# Patient Record
Sex: Female | Born: 1975 | Race: Black or African American | Hispanic: No | Marital: Single | Smoking: Never smoker
Health system: Southern US, Community
[De-identification: ages and names within clinical notes are randomized; demographics above are authoritative.]

## PROBLEM LIST (undated history)

## (undated) DIAGNOSIS — R0600 Dyspnea, unspecified: Secondary | ICD-10-CM

## (undated) DIAGNOSIS — I10 Essential (primary) hypertension: Secondary | ICD-10-CM

## (undated) DIAGNOSIS — G43909 Migraine, unspecified, not intractable, without status migrainosus: Secondary | ICD-10-CM

## (undated) DIAGNOSIS — K219 Gastro-esophageal reflux disease without esophagitis: Secondary | ICD-10-CM

## (undated) DIAGNOSIS — D649 Anemia, unspecified: Secondary | ICD-10-CM

## (undated) HISTORY — PX: ABDOMINAL HYSTERECTOMY: SHX81

## (undated) SURGERY — Surgical Case
Anesthesia: *Unknown

---

## 2001-01-25 ENCOUNTER — Observation Stay (HOSPITAL_COMMUNITY): Admission: AD | Admit: 2001-01-25 | Discharge: 2001-01-25 | Payer: Self-pay | Admitting: *Deleted

## 2001-01-26 ENCOUNTER — Other Ambulatory Visit: Admission: RE | Admit: 2001-01-26 | Discharge: 2001-01-26 | Payer: Self-pay | Admitting: *Deleted

## 2001-01-29 ENCOUNTER — Ambulatory Visit (HOSPITAL_COMMUNITY): Admission: RE | Admit: 2001-01-29 | Discharge: 2001-01-29 | Payer: Self-pay | Admitting: *Deleted

## 2001-01-29 ENCOUNTER — Encounter: Payer: Self-pay | Admitting: *Deleted

## 2001-05-27 ENCOUNTER — Inpatient Hospital Stay (HOSPITAL_COMMUNITY): Admission: AD | Admit: 2001-05-27 | Discharge: 2001-05-29 | Payer: Self-pay | Admitting: *Deleted

## 2003-04-26 ENCOUNTER — Encounter: Payer: Self-pay | Admitting: *Deleted

## 2003-04-26 ENCOUNTER — Emergency Department (HOSPITAL_COMMUNITY): Admission: EM | Admit: 2003-04-26 | Discharge: 2003-04-26 | Payer: Self-pay | Admitting: *Deleted

## 2006-09-21 ENCOUNTER — Emergency Department (HOSPITAL_COMMUNITY): Admission: EM | Admit: 2006-09-21 | Discharge: 2006-09-21 | Payer: Self-pay | Admitting: Emergency Medicine

## 2006-09-22 ENCOUNTER — Emergency Department (HOSPITAL_COMMUNITY): Admission: EM | Admit: 2006-09-22 | Discharge: 2006-09-22 | Payer: Self-pay | Admitting: Emergency Medicine

## 2006-09-29 ENCOUNTER — Observation Stay (HOSPITAL_COMMUNITY): Admission: EM | Admit: 2006-09-29 | Discharge: 2006-10-02 | Payer: Self-pay | Admitting: Emergency Medicine

## 2006-10-10 ENCOUNTER — Encounter (INDEPENDENT_AMBULATORY_CARE_PROVIDER_SITE_OTHER): Payer: Self-pay | Admitting: *Deleted

## 2006-10-10 ENCOUNTER — Ambulatory Visit: Payer: Self-pay | Admitting: Family Medicine

## 2006-10-11 ENCOUNTER — Encounter (INDEPENDENT_AMBULATORY_CARE_PROVIDER_SITE_OTHER): Payer: Self-pay | Admitting: Family Medicine

## 2006-10-11 LAB — CONVERTED CEMR LAB
AST: 17 units/L (ref 0–37)
Albumin: 3.6 g/dL (ref 3.5–5.2)
Alkaline Phosphatase: 85 units/L (ref 39–117)
Anti Nuclear Antibody(ANA): NEGATIVE
Hemoglobin: 10.8 g/dL — ABNORMAL LOW (ref 12.0–15.0)
Iron: 71 ug/dL (ref 42–145)
Lymphocytes Relative: 44 % (ref 12–46)
Neutro Abs: 1.9 10*3/uL (ref 1.7–7.7)
Platelets: 497 10*3/uL — ABNORMAL HIGH (ref 150–400)
Potassium: 3.9 meq/L (ref 3.5–5.3)
RDW: 14.5 % — ABNORMAL HIGH (ref 11.5–14.0)
Retic Count, Absolute: 67 (ref 19.0–186.0)
Retic Ct Pct: 1.7 % (ref 0.4–3.1)
Saturation Ratios: 24 % (ref 20–55)
Sodium: 138 meq/L (ref 135–145)
TIBC: 302 ug/dL (ref 250–470)
Total Protein: 7.8 g/dL (ref 6.0–8.3)

## 2006-10-13 ENCOUNTER — Encounter: Payer: Self-pay | Admitting: Family Medicine

## 2006-10-13 ENCOUNTER — Ambulatory Visit (HOSPITAL_COMMUNITY): Admission: RE | Admit: 2006-10-13 | Discharge: 2006-10-13 | Payer: Self-pay | Admitting: Family Medicine

## 2006-10-13 DIAGNOSIS — G43909 Migraine, unspecified, not intractable, without status migrainosus: Secondary | ICD-10-CM | POA: Insufficient documentation

## 2006-10-13 DIAGNOSIS — D509 Iron deficiency anemia, unspecified: Secondary | ICD-10-CM

## 2006-10-13 DIAGNOSIS — J309 Allergic rhinitis, unspecified: Secondary | ICD-10-CM | POA: Insufficient documentation

## 2006-10-24 ENCOUNTER — Ambulatory Visit: Payer: Self-pay | Admitting: Family Medicine

## 2006-10-25 ENCOUNTER — Encounter (INDEPENDENT_AMBULATORY_CARE_PROVIDER_SITE_OTHER): Payer: Self-pay | Admitting: Family Medicine

## 2006-10-25 LAB — CONVERTED CEMR LAB
Eosinophils Relative: 5 % (ref 0–5)
HCT: 35.3 % — ABNORMAL LOW (ref 36.0–46.0)
Hemoglobin: 11.6 g/dL — ABNORMAL LOW (ref 12.0–15.0)
Lymphocytes Relative: 36 % (ref 12–46)
Lymphs Abs: 1.3 10*3/uL (ref 0.7–3.3)
Monocytes Absolute: 0.3 10*3/uL (ref 0.2–0.7)
Platelets: 333 10*3/uL (ref 150–400)
T3, Free: 3.4 pg/mL (ref 2.3–4.2)
TSH: 0.83 microintl units/mL (ref 0.350–5.50)
WBC: 3.7 10*3/uL — ABNORMAL LOW (ref 4.0–10.5)

## 2006-10-27 ENCOUNTER — Encounter (INDEPENDENT_AMBULATORY_CARE_PROVIDER_SITE_OTHER): Payer: Self-pay | Admitting: *Deleted

## 2006-10-31 ENCOUNTER — Ambulatory Visit: Payer: Self-pay | Admitting: Family Medicine

## 2006-11-05 ENCOUNTER — Emergency Department (HOSPITAL_COMMUNITY): Admission: EM | Admit: 2006-11-05 | Discharge: 2006-11-05 | Payer: Self-pay | Admitting: Emergency Medicine

## 2006-11-06 ENCOUNTER — Emergency Department (HOSPITAL_COMMUNITY): Admission: EM | Admit: 2006-11-06 | Discharge: 2006-11-06 | Payer: Self-pay | Admitting: Emergency Medicine

## 2007-07-11 IMAGING — CR DG ANKLE COMPLETE 3+V*R*
3 series · 3 of 3 positions shown · non-contrast
Comparison: none

CLINICAL DATA: Ankle pain and swelling status post fall yesterday.
 RIGHT ANKLE ? 3 VIEW:

[view not recorded (1 of 3)]
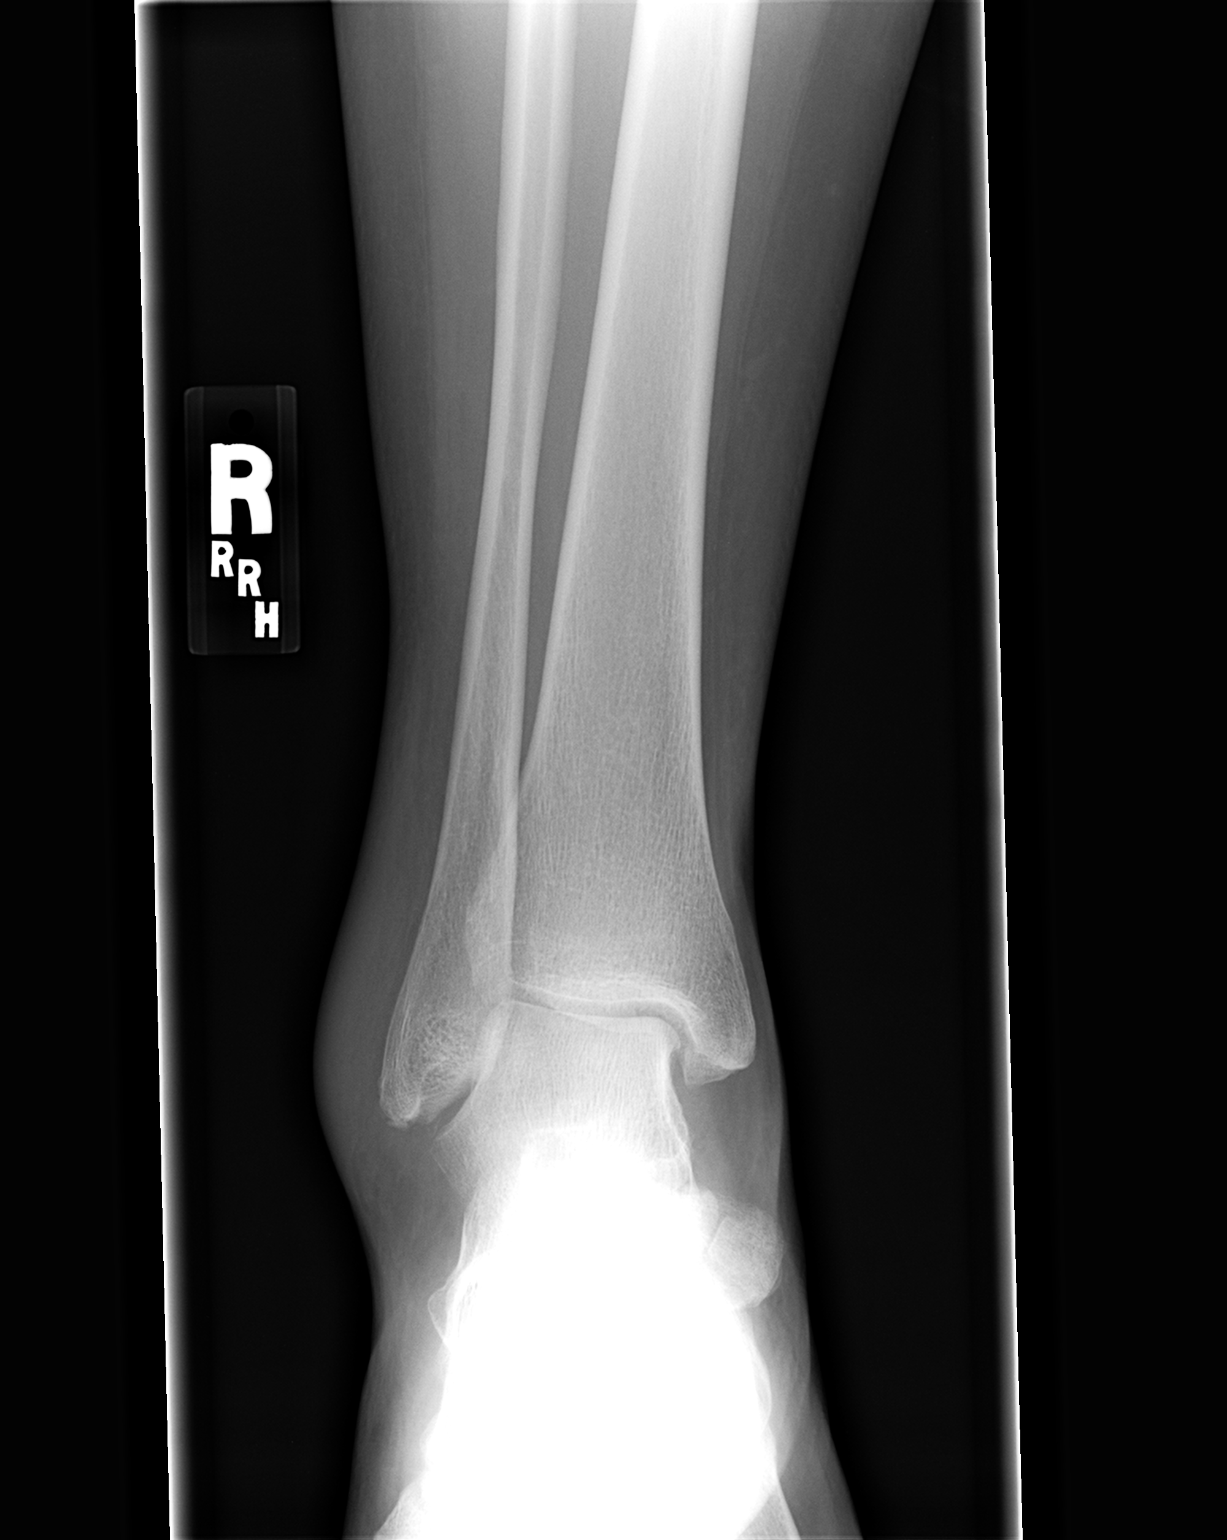

[view not recorded (2 of 3)]
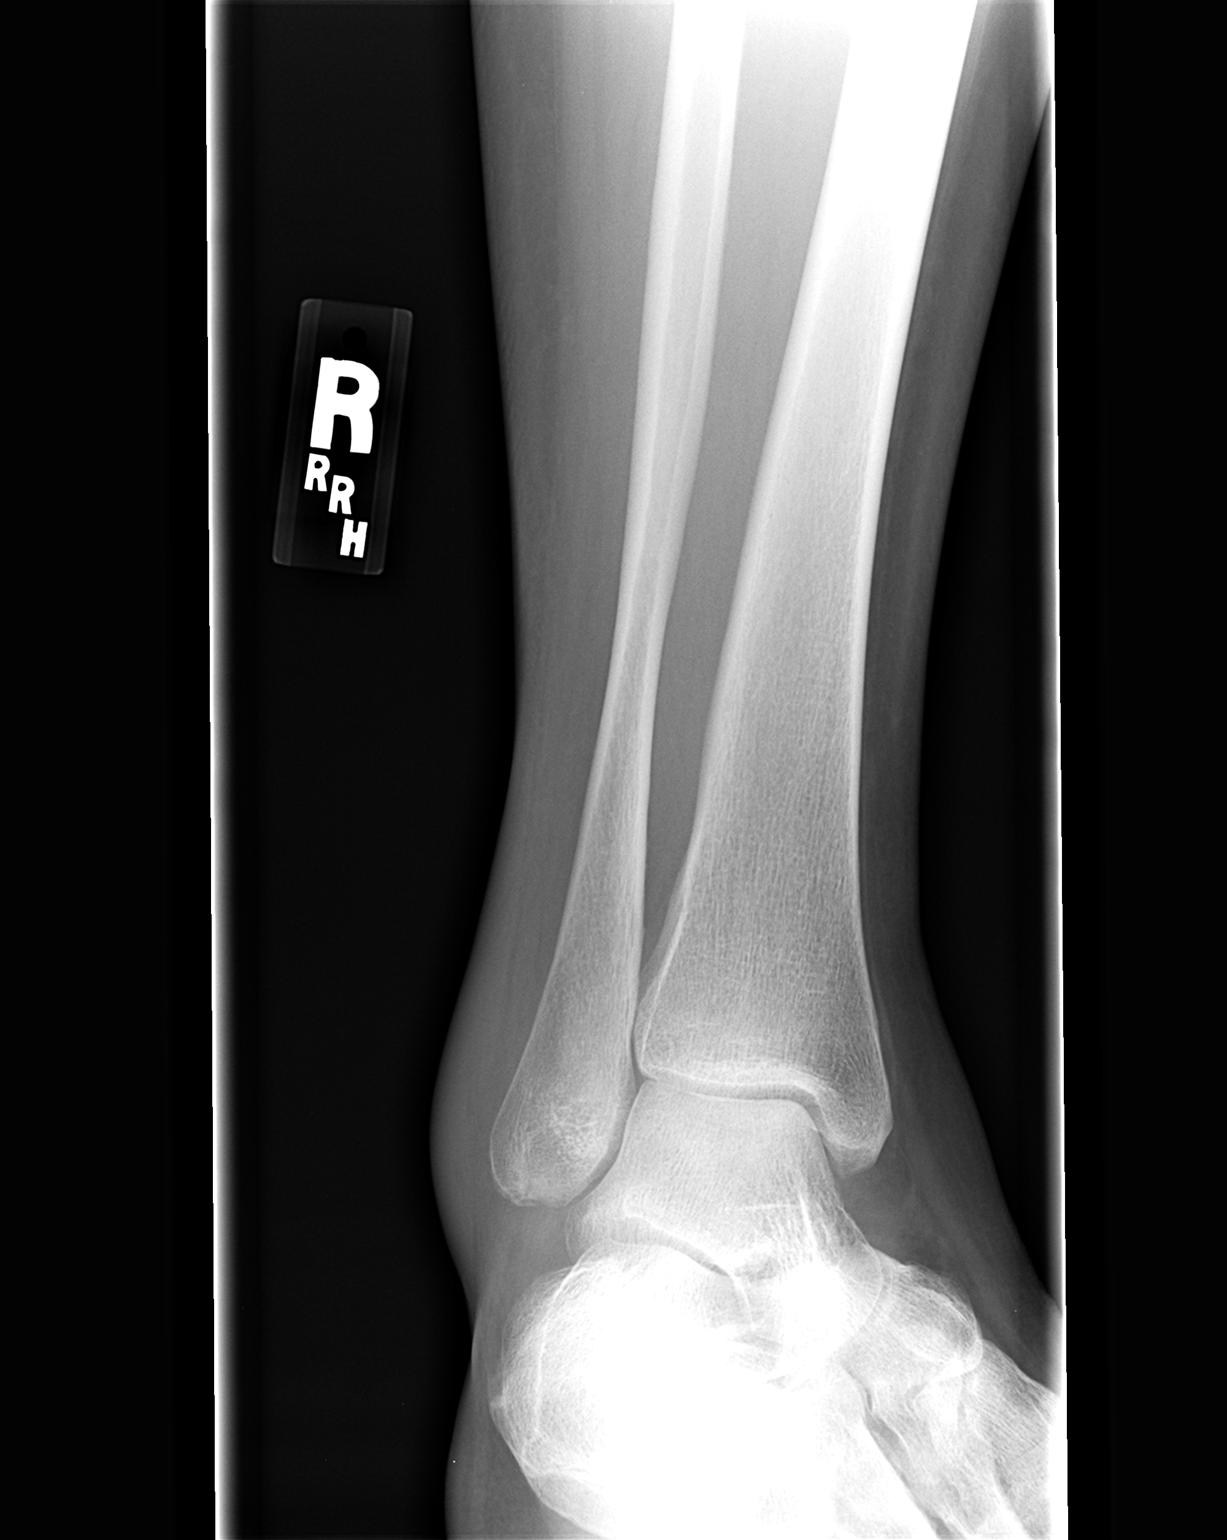

[view not recorded (3 of 3)]
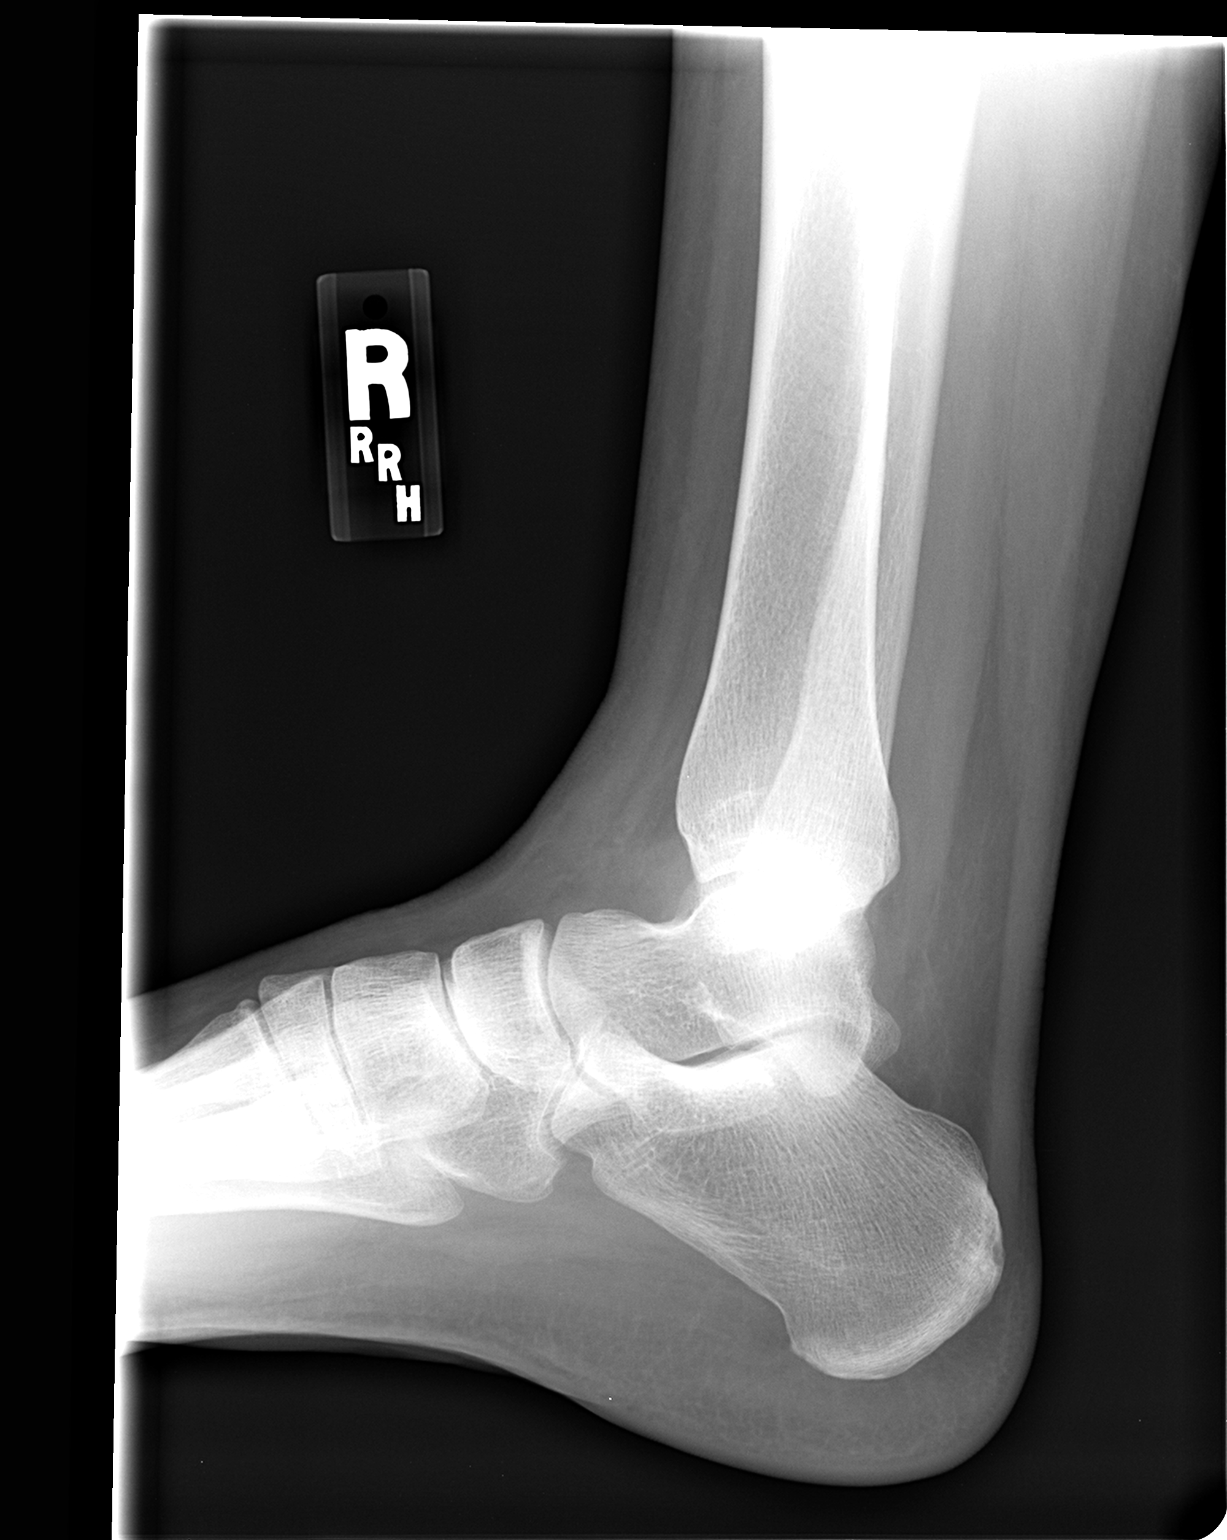

[3 of 3 positions shown; findings below may reference images not displayed]

FINDINGS: There is moderate lateral soft tissue swelling with probable small acute avulsion fractures from the fibular tip.  There is no widening of the ankle mortise or evidence of dislocation. No other fractures are seen. There is a probable small ankle joint effusion.
IMPRESSION: Lateral soft tissue swelling with probable small avulsion fractures from the fibular tip.

## 2008-09-02 ENCOUNTER — Ambulatory Visit: Payer: Self-pay | Admitting: Family Medicine

## 2008-09-02 DIAGNOSIS — E669 Obesity, unspecified: Secondary | ICD-10-CM

## 2008-09-02 DIAGNOSIS — H547 Unspecified visual loss: Secondary | ICD-10-CM | POA: Insufficient documentation

## 2008-09-02 DIAGNOSIS — I10 Essential (primary) hypertension: Secondary | ICD-10-CM | POA: Insufficient documentation

## 2008-09-02 LAB — CONVERTED CEMR LAB

## 2008-09-15 ENCOUNTER — Encounter (INDEPENDENT_AMBULATORY_CARE_PROVIDER_SITE_OTHER): Payer: Self-pay | Admitting: Family Medicine

## 2008-09-16 ENCOUNTER — Ambulatory Visit: Payer: Self-pay | Admitting: Family Medicine

## 2008-09-16 LAB — CONVERTED CEMR LAB
ALT: 8 units/L (ref 0–35)
Albumin: 3.9 g/dL (ref 3.5–5.2)
CO2: 22 meq/L (ref 19–32)
Calcium: 8.9 mg/dL (ref 8.4–10.5)
Chloride: 104 meq/L (ref 96–112)
Cholesterol, target level: 200 mg/dL
Cholesterol: 169 mg/dL (ref 0–200)
Eosinophils Relative: 4 % (ref 0–5)
Glucose, Bld: 91 mg/dL (ref 70–99)
HCT: 35.8 % — ABNORMAL LOW (ref 36.0–46.0)
HDL goal, serum: 40 mg/dL
LDL Goal: 160 mg/dL
Lymphocytes Relative: 37 % (ref 12–46)
Lymphs Abs: 2.1 10*3/uL (ref 0.7–4.0)
Neutro Abs: 3 10*3/uL (ref 1.7–7.7)
Platelets: 370 10*3/uL (ref 150–400)
Potassium: 4.2 meq/L (ref 3.5–5.3)
Sodium: 140 meq/L (ref 135–145)
Total Protein: 7.6 g/dL (ref 6.0–8.3)
Triglycerides: 66 mg/dL (ref <150)
WBC: 5.7 10*3/uL (ref 4.0–10.5)

## 2008-09-26 ENCOUNTER — Telehealth (INDEPENDENT_AMBULATORY_CARE_PROVIDER_SITE_OTHER): Payer: Self-pay | Admitting: *Deleted

## 2008-10-26 ENCOUNTER — Other Ambulatory Visit: Admission: RE | Admit: 2008-10-26 | Discharge: 2008-10-26 | Payer: Self-pay | Admitting: Family Medicine

## 2008-10-26 ENCOUNTER — Encounter (INDEPENDENT_AMBULATORY_CARE_PROVIDER_SITE_OTHER): Payer: Self-pay | Admitting: Family Medicine

## 2008-10-26 ENCOUNTER — Ambulatory Visit: Payer: Self-pay | Admitting: Family Medicine

## 2009-01-20 ENCOUNTER — Ambulatory Visit: Payer: Self-pay | Admitting: Family Medicine

## 2009-01-20 ENCOUNTER — Telehealth (INDEPENDENT_AMBULATORY_CARE_PROVIDER_SITE_OTHER): Payer: Self-pay | Admitting: *Deleted

## 2009-01-20 DIAGNOSIS — N920 Excessive and frequent menstruation with regular cycle: Secondary | ICD-10-CM

## 2009-01-20 LAB — CONVERTED CEMR LAB
Beta hcg, urine, semiquantitative: NEGATIVE
Hemoglobin: 11.8 g/dL

## 2009-01-26 ENCOUNTER — Encounter (INDEPENDENT_AMBULATORY_CARE_PROVIDER_SITE_OTHER): Payer: Self-pay | Admitting: Family Medicine

## 2009-02-20 ENCOUNTER — Encounter (INDEPENDENT_AMBULATORY_CARE_PROVIDER_SITE_OTHER): Payer: Self-pay | Admitting: *Deleted

## 2010-08-03 ENCOUNTER — Ambulatory Visit: Payer: Self-pay | Admitting: Family Medicine

## 2010-08-22 ENCOUNTER — Emergency Department (HOSPITAL_COMMUNITY): Admission: EM | Admit: 2010-08-22 | Discharge: 2010-08-22 | Payer: Self-pay | Admitting: Family Medicine

## 2010-10-21 ENCOUNTER — Encounter: Payer: Self-pay | Admitting: Family Medicine

## 2010-12-11 LAB — POCT RAPID STREP A (OFFICE): Streptococcus, Group A Screen (Direct): NEGATIVE

## 2011-02-15 NOTE — H&P (Signed)
[facility]  Patient:    [name], [name] Visit Number: [id number] MRN: [id number]          Service Type: Attending:  Langley Gauss, M.D. Dictated by:   Langley Gauss, M.D. Adm. Date:  05/27/01                           History and Physical  HISTORY OF PRESENT ILLNESS:  This is a 35 year old patient who was seen in the office for a scheduled visit complaining of irregular uterine contractions and pelvic pressure with uterine tightening. Cervical examination at that time revealed the cervix to be 3 cm dilated, 70% effaced, vertex is zero station. The patient was referred to [facility] in the early stages of labor. The patients prenatal course is uncomplicated. The patients past medical history is noncontributory.  PHYSICAL EXAMINATION:  GENERAL:  Healthy, alert, female.  VITAL SIGNS:  Blood pressure 132/70, pulse rate of 80, respiratory rate 20.  HEENT:  Negative. No adenopathy.  NECK:  Supple. Thyroid is not palpable.  LUNGS:  Clear.  CARDIOVASCULAR:  Regular rate and rhythm.  ABDOMEN:  Soft and nontender. No surgical scars are identified. Fundal height is 38 cm. Vertex presentation by [facility] maneuver.  EXTREMITIES:  Normal.  PELVIC:  Normal external genitalia. No leakage of fluid or bleeding noted. Cervix 3 cm dilated, 70% effaced, vertex at a zero station. Fetal heart tones auscultated in the 150s.  ASSESSMENT:  Term intrauterine pregnancy in the early stages of labor. The patient referred to [facility] for admission and amniotomy. Dictated by:   Langley Gauss, M.D. Attending:  Langley Gauss, M.D. DD:  07/08/01 TD:  07/08/01 Job: [id number]

## 2011-02-15 NOTE — Discharge Summary (Signed)
[name], [name] NO.:  [id number]   MEDICAL RECORD NO.:  [id number]          PATIENT TYPE:  OBV   LOCATION:  A306                          FACILITY:  [facility]   PHYSICIAN:  Marcello Moores, MD   DATE OF BIRTH:  [birth year]   DATE OF ADMISSION:  09/29/2006  DATE OF DISCHARGE:  01/03/2008LH                               DISCHARGE SUMMARY   Ms. [name] is a 35 year old African-American female with no past medical  history who was admitted on September 29, 2006 after presenting with  fever, itching hives and sore throat with generalized arthralgias. This  was for  1 week and she visited the emergency room at [facility] where she received prednisone and Benadryl. She took this  medication without any improvement and she decided to come by EMS to the  emergency room and admission was called. She had not shown any change.  She denies any urinary complaints, any pelvic complaint, no vaginal  discharge or bleeding during the admission.  The only pertinent finding  upon her admission was a temperature of 101 and an elevated white blood  cell count of 19,600. Otherwise the findings were within normal range.  She was admitted for observation and to monitor her white blood cell as  well as vital signs. No antibiotic was given and she was only receiving  symptomatic treatment for her arthralgia and fever. Her temperature and  white blood cell count presently improved and today her temperature was  98 and white blood cell count was 9700.   PHYSICAL EXAMINATION:  VITAL SIGNS: A young patient sitting in a chair  with vital signs showing a blood pressure of 130/70 and temperature was  98, pulse rate 96 and respiratory rate 16 per minute. HEENT: She has  pink conjunctivae, non icteric sclerae. Throat is clear. CHEST with good  air entry bilaterally and no crepitus appreciated.  CVS: S1, S2 were  regular, no murmur is appreciated. ABDOMEN: Soft, no organomegaly, no  tenderness. Bowel sounds are normoactive. EXTREMITIES: She has no  detectable pedal edema, no cyanosis.  SKIN: Moist and there are no  abnormal lesions detected. CNS: Alert and oriented.   LABS AT DISCHARGE:  CBC today reveals a white blood cell count of 9700  and hemoglobin is 11, hematocrit is 32.2 and platelets 176,000.  Chemistries within normal range.   RECOMMENDATIONS:  I am discharging her today with clear advice to have  followup on this coming Monday, October 06, 2006 for possible repeat labs  and to be investigated for possible connective tissue disease. Mild  anemia is also present and I advised her to take iron pills,and discuss  with  her assined pmd.and have proper followup. she understood the  importance of followup.      Marcello Moores, MD  Electronically Signed     MT/MEDQ  D:  10/02/2006  T:  10/02/2006  Job:  [id number]

## 2011-02-15 NOTE — Discharge Summary (Signed)
[name], SCHOOLER              ACCOUNT NO.:  [id number]   MEDICAL RECORD NO.:  [id number]          PATIENT TYPE:  OBV   LOCATION:  A306                          FACILITY:  [facility]   PHYSICIAN:  Marcello Moores, MD   DATE OF BIRTH:  13-Dec-1975   DATE OF ADMISSION:  09/29/2006  DATE OF DISCHARGE:  01/03/2008LH                               DISCHARGE SUMMARY   PRIMARY CARE PHYSICIAN:  She has no primary medical care doctor.   [name] is a 35 year old African-American young patient with no  significant past medical history who was admitted on September 29, 2006.  After she presented to ER with fever and arthralgia and itching lesions  for one week duration.  She was admitted to [facility]  emergency room for that complaint, and she was given prednisone and  Benadryl, and despite she was taking this medication, there was no  improvement, and she decided to come to the emergency room.  The patient  denied any other complaint.  She has no diarrhea.  She has no cough.  She has no urinary complaints.  She has no vaginal discharge.  During  the admission, the only pertinent finding was she has a fever 101, and  she has elevated white blood cells 19.6.  She was admitted with the  assessment of acute viral syndrome for observation and monitoring of  white blood cells and temperature.  On the hospital, her temperature and  white blood cells were monitored, and presently it was decreasing.  Today, her temperature was 98 and white blood cells was 9.7.  Her  arthralgia was also improved, while she was staying in the hospital.   PHYSICAL EXAMINATION:  GENERAL:  A young patient on the chair, sitting  comfortably with her mother.  VITAL SIGNS:  Blood pressure is 130/70 and the temperature is 98, and  the pulse rate 96 and respiratory rate is 16 per minute.  HEENT:  She has pink conjunctivae and an anicteric sclerae.  She has a  clear throat.  CHEST:  She has good air entry bilaterally,  no cramps.  CV:  She has S1, S2, regular rate.  There is no murmur.  ABDOMEN:  She has soft and normoactive bowel sound.  There was no area  of tenderness.  EXTREMITIES:  She has no pitting edema or cyanosis.  SKIN:  There is no invisible abnormal lesions.  CNS:  She is alert and oriented.   LABORATORY STUDIES:  Today, CBC is 9.7 and hematocrit is 33 and  hemoglobin is 11.1, and platelets are 176.  Chemistries within normal  range.  her symptoms rsolved comletly no fever,  no leucocytosis.  Today, she discharged improved and with clearly advice to have followup  with assigned PMD on January 7th, on Monday, to have followup on her CBC  and for possible workup of connective tissue disorders.     Marcello Moores, MD  Electronically Signed    MT/MEDQ  D:  10/02/2006  T:  10/02/2006  Job:  [id number]

## 2011-02-15 NOTE — H&P (Signed)
[name], [name] NO.:  [id number]   MEDICAL RECORD NO.:  [id number]          PATIENT TYPE:  OBV   LOCATION:  A306                          FACILITY:  [facility]   PHYSICIAN:  Gardiner Barefoot, MD    DATE OF BIRTH:  June 08, 1976   DATE OF ADMISSION:  09/29/2006  DATE OF DISCHARGE:  LH                              HISTORY & PHYSICAL   PRIMARY CARE PHYSICIAN:  Patient has no primary physician.   CHIEF COMPLAINT:  Aching in all joints.   HISTORY OF PRESENT ILLNESS:  [name] is a 35 year old African-  American female who about a week ago started with sore throat and hives  developing on her face, which were very itchy, and which she self-  treated symptomatically at home.  It began to go away; however, she  noted that she began to get more hives on her arms and elsewhere on her  body.  She took some Benadryl but continued to feel worse and more itchy  and presented to the emergency room at [facility], where she was given  prednisone and Benadryl.  The patient continued to feel worse, despite  that treatment, and called EMS to bring her into the emergency room here  to be seen.  The patient's basic complaint was her joint complaints all  over, which have essentially been developing over the week and was  initially starting in the knee and has progressed to her upper extremity  joints, including her elbows and fingers as well as her hip joints and  her knees; however, the patient does report that she had not noticed any  swelling and has had full range of motion in her joints, just pain.  The  patient reports  that she has never had hives or any of these issues  prior to this.  Also, patient does report that she has had a fever at  home of 103 during these episodes.   PAST MEDICAL HISTORY:  History of migraine headaches.   MEDICATIONS:  Percocet and prednisone.   ALLERGIES:  PENICILLIN, which causes hives.   Social history: pt denies alcohol, tobacco or drugs.   FAMILY HISTORY:  No history of hives or skin disease in the family.   REVIEW OF SYSTEMS:  A complete 12-point review of systems was obtained  and was negative other than that as presented in the history of present  illness.   PHYSICAL EXAMINATION:  VITAL SIGNS:  Temperature is 98, pulse 115,  respirations 18, blood pressure 117/75.  O2 sat is 98% on room air.  GENERAL:  The patient is awake, alert and oriented x3 and appears in no  acute distress.  HEENT:  Anicteric.  No thrush.  CARDIOVASCULAR:  Regular rate and rhythm.  No murmurs, rubs or gallops.  LUNGS:  Clear to auscultation bilaterally.  ABDOMEN:  Soft, nontender, nondistended.  Positive bowel sounds.  No  hepatosplenomegaly.  EXTREMITIES:  No clubbing, cyanosis or edema.  SKIN:  Dry skin but no rashes.  MUSCULOSKELETAL:  No joint effusions.  Full range of motion.   LABORATORY DATA:  The CBC  has a white count of 19.6 with 87%  neutrophils, hemoglobin 10.3.  Sodium 136, potassium 3.3, chloride 101,  bicarb 25, glucose 102, BUN 14, creatinine 0.74.  UA is negative.  Mono  spot is negative.   ASSESSMENT/PLAN:  Joint effusion:  The patient's etiology brings up a  differential, including rheumatologic issues such as rheumatic fever,  even systemic lupus erythematosus; however, it does not appear that she  really has not had any rash other than hives, which is not consistent  with either lupus or other rheumatologic issues, doubt that at this  time.  Infectious causes could include either gonorrhea or with a rash,  possibly syphilis, which can cause some achiness during the secondary  syphilis.  Otherwise, reactive arthritis is a possibility; however, that  typically only effects one joint.  At this time, will check her urine  for GC and Chlamydia and check an RPR, as well as a CRP, and CMP.  If  all that is unrevealing, we will consider swabs of the throat, rectum,  and vaginal swabs for GC, and will treat the patient  symptomatically at  this time.  If not etiology is determined, we will likely just observe  the patient and have her follow up with a physician as soon as possible.      Gardiner Barefoot, MD  Electronically Signed     RWC/MEDQ  D:  09/29/2006  T:  09/29/2006  Job:  [id number]

## 2011-02-15 NOTE — Discharge Summary (Signed)
[facility]  Patient:    [name], [name] Visit Number: [id number] MRN: [id number]          Service Type: Attending:  Langley Gauss, M.D. Dictated by:   Langley Gauss, M.D. Adm. Date:  05/27/01 Disc. Date: 05/29/01                             Discharge Summary  DIAGNOSES: 1. Term intrauterine pregnancy presenting to [facility] in labor. 2. Dysfunctional labor pattern requiring Pitocin augmentation secondary to    inadequate frequency and intensity of uterine contractions.  PROCEDURE: 1. Precipitous spontaneous cystovaginal delivery. 2. Repair of secondary perineal laceration.  DISPOSITION:  The patient is to follow up in the office in four weeks time. She is bottle feeding. She would like to initiate oral contraceptives for postpartum birth control.  PERTINENT LABORATORY STUDIES:  Admission hemoglobin and hematocrit 10.5/29.8, postpartum day #1 9.7/25.6 with white count of 12.1. The patient is given a copy of standardized discharge instructions at the time of discharge.  HOSPITAL COURSE: See previous dictations. The patient delivered precipitously in a well-controlled atraumatic nurse-controlled delivery. I was present for delivery of the placenta as well as repair of the laceration. Postpartum the patient did well and bonded well with the infant. She did bottle feeding. She was discharged to home on postpartum day #1-1/2.  DISCHARGE MEDICATIONS:  Vicoprofen and Ortho Tri-Cyclen to initiate use in two weeks time. Dictated by:   Langley Gauss, M.D. Attending:  Langley Gauss, M.D. DD:  07/08/01 TD:  07/08/01 Job: [id number]

## 2011-06-05 ENCOUNTER — Encounter: Payer: Self-pay | Admitting: Family Medicine

## 2013-02-01 ENCOUNTER — Emergency Department (HOSPITAL_COMMUNITY)
Admission: EM | Admit: 2013-02-01 | Discharge: 2013-02-01 | Disposition: A | Discharge status: Home / Self Care | Payer: 59 | Attending: Emergency Medicine | Admitting: Emergency Medicine

## 2013-02-01 ENCOUNTER — Encounter (HOSPITAL_COMMUNITY): Payer: Self-pay | Admitting: *Deleted

## 2013-02-01 ENCOUNTER — Emergency Department (HOSPITAL_COMMUNITY): Payer: 59

## 2013-02-01 DIAGNOSIS — G43909 Migraine, unspecified, not intractable, without status migrainosus: Secondary | ICD-10-CM | POA: Insufficient documentation

## 2013-02-01 DIAGNOSIS — D259 Leiomyoma of uterus, unspecified: Secondary | ICD-10-CM

## 2013-02-01 DIAGNOSIS — Z7982 Long term (current) use of aspirin: Secondary | ICD-10-CM | POA: Insufficient documentation

## 2013-02-01 DIAGNOSIS — Z79899 Other long term (current) drug therapy: Secondary | ICD-10-CM | POA: Insufficient documentation

## 2013-02-01 DIAGNOSIS — Z880 Allergy status to penicillin: Secondary | ICD-10-CM | POA: Insufficient documentation

## 2013-02-01 DIAGNOSIS — R1032 Left lower quadrant pain: Secondary | ICD-10-CM | POA: Insufficient documentation

## 2013-02-01 HISTORY — DX: Migraine, unspecified, not intractable, without status migrainosus: G43.909

## 2013-02-01 LAB — CBC WITH DIFFERENTIAL/PLATELET
Basophils Relative: 1 % (ref 0–1)
Eosinophils Absolute: 0.1 10*3/uL (ref 0.0–0.7)
HCT: 35.1 % — ABNORMAL LOW (ref 36.0–46.0)
Hemoglobin: 11.5 g/dL — ABNORMAL LOW (ref 12.0–15.0)
MCH: 26.4 pg (ref 26.0–34.0)
MCHC: 32.8 g/dL (ref 30.0–36.0)
Monocytes Absolute: 0.3 10*3/uL (ref 0.1–1.0)
Monocytes Relative: 5 % (ref 3–12)
Neutrophils Relative %: 58 % (ref 43–77)

## 2013-02-01 LAB — URINALYSIS, ROUTINE W REFLEX MICROSCOPIC
Hgb urine dipstick: NEGATIVE
Specific Gravity, Urine: 1.011 (ref 1.005–1.030)
Urobilinogen, UA: 0.2 mg/dL (ref 0.0–1.0)
pH: 6.5 (ref 5.0–8.0)

## 2013-02-01 LAB — URINE MICROSCOPIC-ADD ON

## 2013-02-01 LAB — POCT PREGNANCY, URINE: Preg Test, Ur: NEGATIVE

## 2013-02-01 LAB — COMPREHENSIVE METABOLIC PANEL
Albumin: 3.8 g/dL (ref 3.5–5.2)
BUN: 9 mg/dL (ref 6–23)
Calcium: 9 mg/dL (ref 8.4–10.5)
Creatinine, Ser: 0.65 mg/dL (ref 0.50–1.10)
Total Protein: 8.6 g/dL — ABNORMAL HIGH (ref 6.0–8.3)

## 2013-02-01 LAB — WET PREP, GENITAL
Trich, Wet Prep: NONE SEEN
Yeast Wet Prep HPF POC: NONE SEEN

## 2013-02-01 LAB — LIPASE, BLOOD: Lipase: 22 U/L (ref 11–59)

## 2013-02-01 MED ORDER — HYDROCODONE-ACETAMINOPHEN 5-325 MG PO TABS: 1.0000 | ORAL_TABLET | Freq: Four times a day (QID) | ORAL | Status: DC | PRN

## 2013-02-01 MED ORDER — MORPHINE SULFATE 4 MG/ML IJ SOLN
4.0000 mg | Freq: Once | INTRAMUSCULAR | Status: AC
  Administered 2013-02-01: 4 mg via INTRAVENOUS
  Filled 2013-02-01: qty 1

## 2013-02-01 NOTE — ED Provider Notes (Signed)
History     CSN: [id number]  Arrival date & time 02/01/13  0418   First MD Initiated Contact with Patient 02/01/13 [phone]      Chief Complaint  Patient presents with  . Abdominal Pain    (Consider location/radiation/quality/duration/timing/severity/associated sxs/prior treatment) HPI History provided by pt.   Pt has had constant, severe, sharp, non-radiating pain in LLQ since 3am.  Onset while at work.  No associated fever, N/V/D, hematochezia/melena, urinary or vaginal sx.  No h/o same.  No h/o abdominal surgeries.  Denies trauma.  Lifts heavy objects at work.  Past Medical History  Diagnosis Date  . Migraines     History reviewed. No pertinent past surgical history.  No family history on file.  History  Substance Use Topics  . Smoking status: Never Smoker   . Smokeless tobacco: Not on file  . Alcohol Use: No    OB History   Grav Para Term Preterm Abortions TAB SAB Ect Mult Living                  Review of Systems  All other systems reviewed and are negative.    Allergies  Penicillins  Home Medications   Current Outpatient Rx  Name  Route  Sig  Dispense  Refill  . aspirin-acetaminophen-caffeine (EXCEDRIN MIGRAINE) 250-250-65 MG per tablet   Oral   Take 1 tablet by mouth every 6 (six) hours as needed for pain (migraines).         . butalbital-acetaminophen-caffeine (FIORICET, ESGIC) 50-325-40 MG per tablet   Oral   Take 1 tablet by mouth 2 (two) times daily as needed for headache.         . promethazine (PHENERGAN) 25 MG tablet   Oral   Take 25 mg by mouth every 6 (six) hours as needed for nausea (associated with migraines).           BP 127/74  Pulse 109  Temp(Src) 97.8 F (36.6 C) (Oral)  Resp 20  SpO2 99%  LMP 01/19/2013  Physical Exam  Nursing note and vitals reviewed. Constitutional: She is oriented to person, place, and time. She appears well-developed and well-nourished. No distress.  Uncomfortable appearing  HENT:  Head:  Normocephalic and atraumatic.  Eyes:  Normal appearance  Neck: Normal range of motion.  Cardiovascular: Normal rate and regular rhythm.   Pulmonary/Chest: Effort normal and breath sounds normal. No respiratory distress.  Abdominal: Soft. Bowel sounds are normal. She exhibits no distension and no mass. There is no rebound and no guarding.  Diffuse L-sided abdominal tenderness  Genitourinary:  No CVA tenderness.  Nml external genitalia. Moderate amt thin, white vaginal discharge.  Cervix closed.  There appears to be an erythematous mass inferior to cervical os. No adnexal or cervical motion tenderess.    Musculoskeletal: Normal range of motion.  Neurological: She is alert and oriented to person, place, and time.  Skin: Skin is warm and dry. No rash noted.  Psychiatric: She has a normal mood and affect. Her behavior is normal.    ED Course  Procedures (including critical care time)  Labs Reviewed  WET PREP, GENITAL  GC/CHLAMYDIA PROBE AMP  URINALYSIS, ROUTINE W REFLEX MICROSCOPIC  CBC WITH DIFFERENTIAL  COMPREHENSIVE METABOLIC PANEL  LIPASE, BLOOD   No results found.   No diagnosis found.    MDM  37yo healthy F presents w/ LLQ pain since 3am.  No associated sx.  On exam, afebrile, uncomfortable appearing, abd soft/non-distended, diffuse L-sided ttp, vaginal discharge,  cervix erythematous.  Labs pending.  Transvaginal US to r/o TOA ordered.  Pt has received morphine for pain. Lawyer, PA-C to dispo.  6:14 AM         Otilio Miu, PA-C 02/01/13 [phone]

## 2013-02-01 NOTE — ED Provider Notes (Signed)
Medical screening examination/treatment/procedure(s) were performed by non-physician practitioner and as supervising physician I was immediately available for consultation/collaboration.  Malaak Stach M Kee Drudge, MD 02/01/13 0742 

## 2013-02-01 NOTE — ED Provider Notes (Signed)
Medical screening examination/treatment/procedure(s) were performed by non-physician practitioner and as supervising physician I was immediately available for consultation/collaboration.  Olivia Mackie, MD 02/01/13 [phone]

## 2013-02-01 NOTE — ED Provider Notes (Signed)
  Physical Exam  BP 140/92  Pulse 88  Temp(Src) 97.5 F (36.4 C) (Oral)  Resp 18  SpO2 100%  LMP 01/19/2013  Physical Exam  ED Course  Procedures I reassessed the patient and she still had some mild L lower abd pain on exam. No guarding or rebound. The patient advised that she felt much better at this time. The patient is advised that she could have an evolving process that has yet to fully declare itself and that she will need to return here for any worsening in her condition. The patient is advised to follow up with her PCP. I also advised her of the endometrial and uterine findings on [place]. I advised that these will need close follow up.     Carlyle Dolly, PA-C 02/01/13 [phone]

## 2013-02-01 NOTE — ED Notes (Signed)
Patient states she was at work [employer] when she started having left lower abdominal pain , denies n/v/d

## 2013-02-02 LAB — GC/CHLAMYDIA PROBE AMP: CT Probe RNA: NEGATIVE

## 2013-02-26 ENCOUNTER — Ambulatory Visit (INDEPENDENT_AMBULATORY_CARE_PROVIDER_SITE_OTHER): Payer: 59 | Admitting: Obstetrics & Gynecology

## 2013-02-26 ENCOUNTER — Encounter: Payer: Self-pay | Admitting: Obstetrics & Gynecology

## 2013-02-26 VITALS — BP 150/92 | HR 87 | Temp 97.2°F | Ht 64.0 in | Wt 200.9 lb

## 2013-02-26 DIAGNOSIS — N920 Excessive and frequent menstruation with regular cycle: Secondary | ICD-10-CM

## 2013-02-26 DIAGNOSIS — I10 Essential (primary) hypertension: Secondary | ICD-10-CM

## 2013-02-26 DIAGNOSIS — N949 Unspecified condition associated with female genital organs and menstrual cycle: Secondary | ICD-10-CM

## 2013-02-26 DIAGNOSIS — R1032 Left lower quadrant pain: Secondary | ICD-10-CM

## 2013-02-26 DIAGNOSIS — G8929 Other chronic pain: Secondary | ICD-10-CM

## 2013-02-26 MED ORDER — DICLOFENAC SODIUM 75 MG PO TBEC: 75.0000 mg | DELAYED_RELEASE_TABLET | Freq: Two times a day (BID) | ORAL | Status: DC | PRN

## 2013-02-26 NOTE — Assessment & Plan Note (Signed)
Advised a DASH type diet rich in vegetables and fruit and low in salt. Advised patient she needs to follow up with new PCP. Offered patient referral to [facility] resident clinic but she refused.

## 2013-02-26 NOTE — Progress Notes (Signed)
Subjective:  Patient presents today to establish care. Chief complaint-noted.   # ED follow up for LLQ pain as well as uterine fibroids and 1cm endometrial lesion. Also had menorrhagia within 9 months.  Patient seen in ED on 02/01/13 for severe 10/10 abdominal pain in LLQ which did not radiate. Workup was unrevealing for cause of pain but there was an incidental fibroid uterus noted as well as 1cm endometrial hypoechoic lesion.   Patient presents today to establish care and to follow up on above isuses. States she is intermittently having LLQ pain up to 5/10 lasting for 2 hours every 3-4 days for her. No dysuria/polyuria. No fever/chills/nausea/vomiting. Worse with positions such as bending over but really has not effected her much since the first visit to ED. Takes ibuprofen and pain controlled. She has not been taking the norco due pain occuring when she was in the   Patient states she has had regular periods and need to change pads about every 4 hours until 8-9 months ago. 8-9 months ago.  She states periods became heavier and now she requires pad changes every 1.5 hours typically but that they have remained regular. Periods are not particularly painful.   Hypertension- BP Readings from Last 3 Encounters:  02/26/13 150/92  02/01/13 140/92  08/03/10 132/90  Home BP monitoring-no Compliant with medications-no medications Denies any CP, HA, SOB, blurry vision, LE edema, transient weakness, orthopnea, PND.   The following were reviewed and entered/updated in epic: Past Medical History  Diagnosis Date  . 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  Medications- reviewed and updated Reviewed problem list.  Allergies-reviewed and updated   ROS--See HPI   Objective: BP 150/92  Pulse 87  Temp(Src) 97.2 F (36.2 C) (Oral)  Ht 5\' 4"  (1.626 m)  Wt 200 lb 14.4 oz (91.128 kg)  BMI 34.47 kg/m2  LMP 02/12/2013 Gen: NAD, resting  comfortably  HEENT: NCAT, MMM, PERRLA  CV: RRR no mrg  Lungs: CTAB  Abd: soft/nontender/nondistended/normal bowel sounds. No masses or organomegaly. Fibroids not palpable from abdominal exam.  Ext:  no edema  Skin: warm and dry, no rash  Neuro: grossly normal, moves all extremities   02/01/2013   TRANSABDOMINAL AND TRANSVAGINAL ULTRASOUND OF PELVIS DOPPLER ULTRASOUND OF OVARIES  Clinical Data:  Tuboovarian abscess.  Ovarian torsion.  Pelvic pain.  Technique:  Both transabdominal and transvaginal ultrasound examinations of the pelvis were performed. Transabdominal technique was performed for global imaging of the pelvis including uterus, ovaries, adnexal regions, and pelvic cul-de-sac.  It was necessary to proceed with endovaginal exam following the transabdominal exam to visualize the uterus and adnexal structures.  Color and duplex Doppler ultrasound was utilized to evaluate blood flow to the ovaries.  Comparison:  None.  Findings:  Uterus:  Uterus is enlarged measuring 14.7 cm x 6.5 cm x 7.2 cm. Multiple fibroids are present.  The uterus is retroverted and retroflexed.  Multiple myometrial fibroids are present with the largest measuring 7.6 x 5.9 x 6.5 cm in the fundus.  Endometrium:  Normal thickness at 6 mm.  There is a small hypoechoic area measuring 1 cm.  Differential considerations include subendometrial fibroid, polyp, or neoplasm.  Right ovary: Physiologic appearance.  33 mm x 14 mm x 18 mm.  Left ovary:   27 mm x 21 mm x 19 mm.  Pulsed Doppler evaluation demonstrates normal low-resistance arterial and venous waveforms in both ovaries.  Small amount of simple  free fluid is present in the anatomic pelvis.  IMPRESSION: 1.  No acute abnormality.  Negative for torsion. 2.  Fibroid uterus. 3.  1 cm endometrial lesion that is hypoechoic with questionable internal vascular flow.  Follow-up sonohysterogram recommended.   Original Report Authenticated By: Andreas Newport, M.D.    Assessment/Plan: LLQ  pain-Unlikely gynecological in etiology given largest fibroid is midline. Patient can use Diclofenac prn for pain control. Will continue to monitor symptoms Menorrhagia - Patient will return for further evaluation with endometrial biopsy, will also have annual pap smear at that time HTN - Follow up with PCP    Attestation of Attending Supervision of Resident: Evaluation and management procedures were performed by the [facility] Medicine Resident under my supervision.  I have seen and examined the patient, reviewed the resident's note and chart, and I agree with the management and plan.  Jaynie Collins, MD, FACOG Attending Obstetrician & Gynecologist Faculty Practice, [facility]

## 2013-02-26 NOTE — Patient Instructions (Signed)
Before your next visit, take your diclofenac that morning in preparation for your endometrial biopsy. I strongly encourage you to get a primary care doctor given your blood pressure today and need for general health maintenance.  We will do a pap smear and physical at next visit.   Endometrial Biopsy This is a test in which a tissue sample (a biopsy) is taken from inside the uterus (womb). It is then looked at by a specialist under a microscope to see if the tissue is normal or abnormal. The endometrium is the lining of the uterus. This test helps determine where you are in your menstrual cycle and how hormone levels are affecting the lining of the uterus. Another use for this test is to diagnose endometrial cancer, tuberculosis, polyps, or inflammatory conditions and to evaluate uterine bleeding. PREPARATION FOR TEST No preparation or fasting is necessary. NORMAL FINDINGS No pathologic conditions. Presence of "secretory-type" endometrium 3 to 5 days before to normal menstruation. Ranges for normal findings may vary among different laboratories and hospitals. You should always check with your doctor after having lab work or other tests done to discuss the meaning of your test results and whether your values are considered within normal limits. MEANING OF TEST  Your caregiver will go over the test results with you and discuss the importance and meaning of your results, as well as treatment options and the need for additional tests if necessary. OBTAINING THE TEST RESULTS It is your responsibility to obtain your test results. Ask the lab or department performing the test when and how you will get your results. Document Released: 01/17/2005 Document Revised: 12/09/2011 Document Reviewed: 08/26/2008 [facility] Patient Information 2014 [place], [place].

## 2013-02-26 NOTE — Assessment & Plan Note (Signed)
New onset within last 9 months. 6mm thickness on endometrium but with ? Small polyp. Will have patient return for endometrial biopsy at her earliest convenience and complete pap smear at that time. Possibly caused/contributed to by fundal fibroid. Will await endometrial biopsy before further plans. In the meantime, patient instructed to use Diclofenac for 1-2 days before period and then for 5 days.

## 2014-05-17 ENCOUNTER — Other Ambulatory Visit: Payer: Self-pay | Admitting: Obstetrics and Gynecology

## 2014-05-17 DIAGNOSIS — R928 Other abnormal and inconclusive findings on diagnostic imaging of breast: Secondary | ICD-10-CM

## 2014-07-27 ENCOUNTER — Other Ambulatory Visit: Payer: Self-pay | Admitting: Obstetrics and Gynecology

## 2014-07-27 ENCOUNTER — Ambulatory Visit
Admission: RE | Admit: 2014-07-27 | Discharge: 2014-07-27 | Disposition: A | Discharge status: Home / Self Care | Payer: 59 | Source: Ambulatory Visit | Attending: Obstetrics and Gynecology | Admitting: Obstetrics and Gynecology

## 2014-07-27 DIAGNOSIS — N6310 Unspecified lump in the right breast, unspecified quadrant: Secondary | ICD-10-CM

## 2014-07-27 DIAGNOSIS — R928 Other abnormal and inconclusive findings on diagnostic imaging of breast: Secondary | ICD-10-CM

## 2014-08-01 ENCOUNTER — Other Ambulatory Visit: Payer: Self-pay | Admitting: Obstetrics and Gynecology

## 2014-08-01 ENCOUNTER — Encounter: Payer: Self-pay | Admitting: Obstetrics & Gynecology

## 2014-08-01 DIAGNOSIS — N6310 Unspecified lump in the right breast, unspecified quadrant: Secondary | ICD-10-CM

## 2014-08-09 ENCOUNTER — Ambulatory Visit
Admission: RE | Admit: 2014-08-09 | Discharge: 2014-08-09 | Disposition: A | Discharge status: Home / Self Care | Payer: 59 | Source: Ambulatory Visit | Attending: Obstetrics and Gynecology | Admitting: Obstetrics and Gynecology

## 2014-08-09 DIAGNOSIS — N6310 Unspecified lump in the right breast, unspecified quadrant: Secondary | ICD-10-CM

## 2014-08-22 ENCOUNTER — Other Ambulatory Visit (INDEPENDENT_AMBULATORY_CARE_PROVIDER_SITE_OTHER): Payer: Self-pay | Admitting: General Surgery

## 2014-08-22 DIAGNOSIS — R928 Other abnormal and inconclusive findings on diagnostic imaging of breast: Secondary | ICD-10-CM

## 2014-08-22 NOTE — H&P (Signed)
[name] 08/22/2014 10:24 AM Location: [facility] Patient #: [id number] DOB: April 29, 1976 Single / Language: Cleophus Molt / Race: Black or African American Female  History of Present Illness Stark Klein MD; 08/22/2014 11:10 AM) Patient words: rt breast check.  The patient is a 38 year old female who presents with a complaint of nipple discharge. Pt presents with an incidental discovery of nipple discharge. She went to her GYN (Dr. Harrington Challenger) for a pap, and Dr. Harrington Challenger found the discharge on physical exam. She is referred by Dr. Augustin Coupe from radiology for consultation regarding this issue. There is no spontaneous discharge, only with squeezing the nipple. It is yellowish red. She was recommended to get a diagnostic mammogram. 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 She denies breast pain, breast mass, or other breast history. She has no family history of cancer. Menarche was at age 1. She had her first child between age 77-20. She underwent a biopsy of the lesion seen on mammogram and pathology is below. 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 - SEE MICROSCOPIC DESCRIPTION.   Other Problems Briant Cedar, CMA; 08/22/2014 10:24 AM) Back Pain Gastroesophageal Reflux Disease Migraine Headache  Past Surgical History Briant Cedar, [place]; 08/22/2014 10:24 AM) Breast Biopsy Right.  Diagnostic Studies History Briant Cedar, [place]; 08/22/2014 10:24 AM) Colonoscopy never Mammogram within last year Pap Smear 1-5 years ago  Allergies Briant Cedar, [place]; 08/22/2014 10:25 AM) Penicillins  Medication History Briant Cedar, CMA; 08/22/2014 10:26 AM) Tranexamic Acid (650MG  Tablet, Oral) Active.  Social History Briant Cedar, [place]; 08/22/2014 10:24  AM) Alcohol use Occasional alcohol use. Caffeine use Carbonated beverages, Tea. No drug use Tobacco use Never smoker.  Family History Briant Cedar, [place]; 08/22/2014 10:24 AM) Heart disease in female family member before age 49 Hypertension Father, Mother. Migraine Headache Mother.  Pregnancy / Birth History Briant Cedar, CMA; 08/22/2014 10:24 AM) Age at menarche 89 years. Contraceptive History Oral contraceptives. Gravida 2 Maternal age 79-20 Para 2  Review of Systems Briant Cedar CMA; 08/22/2014 10:24 AM) General Present- Fatigue. Not Present- Appetite Loss, Chills, Fever, Night Sweats, Weight Gain and Weight Loss. Skin Present- Dryness. Not Present- Change in Wart/Mole, Hives, Jaundice, New Lesions, Non-Healing Wounds, Rash and Ulcer. HEENT Present- Seasonal Allergies. Not Present- Earache, Hearing Loss, Hoarseness, Nose Bleed, Oral Ulcers, Ringing in the Ears, Sinus Pain, Sore Throat, Visual Disturbances, Wears glasses/contact lenses and Yellow Eyes. Respiratory Not Present- Bloody sputum, Chronic Cough, Difficulty Breathing, Snoring and Wheezing. Breast Present- Breast Mass and Breast Pain. Not Present- Nipple Discharge and Skin Changes. Cardiovascular Not Present- Chest Pain, Difficulty Breathing Lying Down, Leg Cramps, Palpitations, Rapid Heart Rate, Shortness of Breath and Swelling of Extremities. Gastrointestinal Present- Indigestion. Not Present- Abdominal Pain, Bloating, Bloody Stool, Change in Bowel Habits, Chronic diarrhea, Constipation, Difficulty Swallowing, Excessive gas, Gets full quickly at meals, Hemorrhoids, Nausea, Rectal Pain and Vomiting. Female Genitourinary Present- Frequency and Urgency. Not Present- Nocturia, Painful Urination and Pelvic Pain. Musculoskeletal Present- Back Pain and Joint Stiffness. Not Present- Joint Pain, Muscle Pain, Muscle Weakness and Swelling of Extremities. Neurological Present- Headaches. Not Present- Decreased Memory,  Fainting, Numbness, Seizures, Tingling, Tremor, Trouble walking and Weakness. Psychiatric Present- Change in Sleep Pattern. Not Present- Anxiety, Bipolar, Depression, Fearful and Frequent crying. Endocrine Not Present- Cold Intolerance, Excessive Hunger, Hair Changes, Heat Intolerance, Hot flashes and New  Diabetes. Hematology Present- Easy Bruising. Not Present- Excessive bleeding, Gland problems, HIV and Persistent Infections.   Vitals Briant Cedar CMA; 08/22/2014 10:26 AM) 08/22/2014 10:26 AM Weight: 201.38 lb Height: 64in Body Surface Area: 2.03 m Body Mass Index: 34.57 kg/m Temp.: 98.30F  Pulse: 74 (Regular)  BP: 140/90 (Sitting, Left Arm, Standard)    Physical Exam Stark Klein MD; 08/22/2014 11:06 AM) General Mental Status-Alert. General Appearance-Consistent with stated age. Hydration-Well hydrated. Voice-Normal.  Head and Neck Head-normocephalic, atraumatic with no lesions or palpable masses. Trachea-midline. Thyroid Gland Characteristics - normal size and consistency.  Eye Eyeball - Bilateral-Extraocular movements intact. Sclera/Conjunctiva - Bilateral-No scleral icterus.  Chest and Lung Exam Chest and lung exam reveals -quiet, even and easy respiratory effort with no use of accessory muscles and on auscultation, normal breath sounds, no adventitious sounds and normal vocal resonance. Inspection Chest Wall - Normal. Back - normal.  Breast Note: Breasts are symmetric bilaterally. Left breast is slightly smaller. She has a small skin lesion at 2 oclock on the left breast near the anterior axillary line that feels like a small sebaceous cyst. She has serosanguinous discharge of her right nipple with squeezing. There are no masses or LAD. She has no skin dimpling or lymphadenopathy.   Cardiovascular Cardiovascular examination reveals -normal heart sounds, regular rate and rhythm with no murmurs and normal pedal pulses  bilaterally.  Abdomen Inspection Inspection of the abdomen reveals - No Hernias. Palpation/Percussion Palpation and Percussion of the abdomen reveal - Soft, Non Tender, No Rebound tenderness, No Rigidity (guarding) and No hepatosplenomegaly. Auscultation Auscultation of the abdomen reveals - Bowel sounds normal.  Neurologic Neurologic evaluation reveals -alert and oriented x 3 with no impairment of recent or remote memory. Mental Status-Normal.  Musculoskeletal Global Assessment -Note: no gross deformities.  Normal Exam - Left-Upper Extremity Strength Normal and Lower Extremity Strength Normal. Normal Exam - Right-Upper Extremity Strength Normal and Lower Extremity Strength Normal.  Lymphatic Head & Neck  General Head & Neck Lymphatics: Bilateral - Description - Normal. Axillary  General Axillary Region: Bilateral - Description - Normal. Tenderness - Non Tender. Femoral & Inguinal  Generalized Femoral & Inguinal Lymphatics: Bilateral - Description - No Generalized lymphadenopathy.    Assessment & Plan Stark Klein MD; 08/22/2014 11:09 AM) ABNORMAL MAMMOGRAM OF RIGHT BREAST (793.80  R92.8) Impression: We will plan a seed localized lumpectomy for the papilloma to make sure she has no DCIS associated with it. We discussed surgery including location of the incision, risks, what to expect wtih the seed placement, need for help post op and post op restrictions.  I reviewed the risks of bleeding, infection, need for additional procedures, the possibility of finding cancer, and the risk of seroma.  She understands and wishes to proceed. Current Plans  Schedule for Surgery Pt Education - [facility] Breast Biopsy Instructions (FLB): discussed with patient and provided information.   Signed by Stark Klein, MD (08/22/2014 11:11 AM)

## 2014-09-21 ENCOUNTER — Encounter (HOSPITAL_BASED_OUTPATIENT_CLINIC_OR_DEPARTMENT_OTHER): Payer: Self-pay | Admitting: *Deleted

## 2014-09-26 ENCOUNTER — Ambulatory Visit
Admission: RE | Admit: 2014-09-26 | Discharge: 2014-09-26 | Disposition: A | Discharge status: Home / Self Care | Payer: 59 | Source: Ambulatory Visit | Attending: General Surgery | Admitting: General Surgery

## 2014-09-29 ENCOUNTER — Ambulatory Visit (HOSPITAL_BASED_OUTPATIENT_CLINIC_OR_DEPARTMENT_OTHER): Payer: 59 | Admitting: Anesthesiology

## 2014-09-29 ENCOUNTER — Ambulatory Visit (HOSPITAL_BASED_OUTPATIENT_CLINIC_OR_DEPARTMENT_OTHER)
Admission: RE | Admit: 2014-09-29 | Discharge: 2014-09-29 | Disposition: A | Discharge status: Home / Self Care | Payer: 59 | Source: Ambulatory Visit | Attending: General Surgery | Admitting: General Surgery

## 2014-09-29 ENCOUNTER — Encounter (HOSPITAL_BASED_OUTPATIENT_CLINIC_OR_DEPARTMENT_OTHER)
Admission: RE | Disposition: A | Discharge status: Home / Self Care | Payer: Self-pay | Source: Ambulatory Visit | Attending: General Surgery

## 2014-09-29 ENCOUNTER — Encounter (HOSPITAL_BASED_OUTPATIENT_CLINIC_OR_DEPARTMENT_OTHER): Payer: Self-pay | Admitting: Anesthesiology

## 2014-09-29 ENCOUNTER — Ambulatory Visit
Admission: RE | Admit: 2014-09-29 | Discharge: 2014-09-29 | Disposition: A | Discharge status: Home / Self Care | Payer: 59 | Source: Ambulatory Visit | Attending: General Surgery | Admitting: General Surgery

## 2014-09-29 DIAGNOSIS — Z79899 Other long term (current) drug therapy: Secondary | ICD-10-CM | POA: Insufficient documentation

## 2014-09-29 DIAGNOSIS — G43909 Migraine, unspecified, not intractable, without status migrainosus: Secondary | ICD-10-CM | POA: Insufficient documentation

## 2014-09-29 DIAGNOSIS — Z9889 Other specified postprocedural states: Secondary | ICD-10-CM | POA: Insufficient documentation

## 2014-09-29 DIAGNOSIS — K219 Gastro-esophageal reflux disease without esophagitis: Secondary | ICD-10-CM | POA: Insufficient documentation

## 2014-09-29 DIAGNOSIS — Z6834 Body mass index (BMI) 34.0-34.9, adult: Secondary | ICD-10-CM | POA: Insufficient documentation

## 2014-09-29 DIAGNOSIS — E669 Obesity, unspecified: Secondary | ICD-10-CM | POA: Diagnosis not present

## 2014-09-29 DIAGNOSIS — R928 Other abnormal and inconclusive findings on diagnostic imaging of breast: Secondary | ICD-10-CM | POA: Insufficient documentation

## 2014-09-29 DIAGNOSIS — N6452 Nipple discharge: Secondary | ICD-10-CM | POA: Insufficient documentation

## 2014-09-29 DIAGNOSIS — Z880 Allergy status to penicillin: Secondary | ICD-10-CM | POA: Diagnosis not present

## 2014-09-29 DIAGNOSIS — I10 Essential (primary) hypertension: Secondary | ICD-10-CM | POA: Diagnosis not present

## 2014-09-29 HISTORY — PX: BREAST LUMPECTOMY WITH RADIOACTIVE SEED LOCALIZATION: SHX6424

## 2014-09-29 LAB — POCT HEMOGLOBIN-HEMACUE: Hemoglobin: 9.1 g/dL — ABNORMAL LOW (ref 12.0–15.0)

## 2014-09-29 SURGERY — BREAST LUMPECTOMY WITH RADIOACTIVE SEED LOCALIZATION
Anesthesia: General | Site: Breast | Laterality: Right

## 2014-09-29 MED ORDER — SODIUM CHLORIDE 0.9 % IJ SOLN: 3.0000 mL | Freq: Two times a day (BID) | INTRAMUSCULAR | Status: DC

## 2014-09-29 MED ORDER — LIDOCAINE HCL (CARDIAC) 20 MG/ML IV SOLN
INTRAVENOUS | Status: DC | PRN
  Administered 2014-09-29: 80 mg via INTRAVENOUS

## 2014-09-29 MED ORDER — OXYCODONE HCL 5 MG PO TABS: 5.0000 mg | ORAL_TABLET | ORAL | Status: DC | PRN

## 2014-09-29 MED ORDER — FENTANYL CITRATE 0.05 MG/ML IJ SOLN: 50.0000 ug | INTRAMUSCULAR | Status: DC | PRN

## 2014-09-29 MED ORDER — SODIUM CHLORIDE 0.9 % IV SOLN: 250.0000 mL | INTRAVENOUS | Status: DC | PRN

## 2014-09-29 MED ORDER — ACETAMINOPHEN 650 MG RE SUPP: 650.0000 mg | RECTAL | Status: DC | PRN

## 2014-09-29 MED ORDER — FENTANYL CITRATE 0.05 MG/ML IJ SOLN
INTRAMUSCULAR | Status: AC
  Filled 2014-09-29: qty 6

## 2014-09-29 MED ORDER — ACETAMINOPHEN 325 MG PO TABS: 650.0000 mg | ORAL_TABLET | ORAL | Status: DC | PRN

## 2014-09-29 MED ORDER — OXYCODONE-ACETAMINOPHEN 5-325 MG PO TABS: 1.0000 | ORAL_TABLET | ORAL | Status: DC | PRN

## 2014-09-29 MED ORDER — FENTANYL CITRATE 0.05 MG/ML IJ SOLN
25.0000 ug | INTRAMUSCULAR | Status: DC | PRN
  Administered 2014-09-29: 25 ug via INTRAVENOUS
  Administered 2014-09-29: 50 ug via INTRAVENOUS

## 2014-09-29 MED ORDER — BUPIVACAINE-EPINEPHRINE 0.25% -1:200000 IJ SOLN
INTRAMUSCULAR | Status: DC | PRN
Start: 2014-09-29 — End: 2014-09-29
  Administered 2014-09-29: 10 mL

## 2014-09-29 MED ORDER — FENTANYL CITRATE 0.05 MG/ML IJ SOLN
INTRAMUSCULAR | Status: AC
  Filled 2014-09-29: qty 2

## 2014-09-29 MED ORDER — EPHEDRINE SULFATE 50 MG/ML IJ SOLN
INTRAMUSCULAR | Status: DC | PRN
  Administered 2014-09-29: 10 mg via INTRAVENOUS

## 2014-09-29 MED ORDER — BUPIVACAINE-EPINEPHRINE (PF) 0.25% -1:200000 IJ SOLN
INTRAMUSCULAR | Status: AC
  Filled 2014-09-29: qty 30

## 2014-09-29 MED ORDER — BUPIVACAINE HCL (PF) 0.25 % IJ SOLN
INTRAMUSCULAR | Status: AC
  Filled 2014-09-29: qty 30

## 2014-09-29 MED ORDER — MIDAZOLAM HCL 2 MG/2ML IJ SOLN
INTRAMUSCULAR | Status: AC
  Filled 2014-09-29: qty 2

## 2014-09-29 MED ORDER — PROPOFOL 10 MG/ML IV BOLUS
INTRAVENOUS | Status: DC | PRN
  Administered 2014-09-29: 200 mg via INTRAVENOUS

## 2014-09-29 MED ORDER — FENTANYL CITRATE 0.05 MG/ML IJ SOLN
INTRAMUSCULAR | Status: DC | PRN
  Administered 2014-09-29 (×3): 25 ug via INTRAVENOUS
  Administered 2014-09-29 (×2): 50 ug via INTRAVENOUS

## 2014-09-29 MED ORDER — CIPROFLOXACIN IN D5W 400 MG/200ML IV SOLN: 400.0000 mg | INTRAVENOUS | Status: DC

## 2014-09-29 MED ORDER — CIPROFLOXACIN IN D5W 400 MG/200ML IV SOLN
INTRAVENOUS | Status: AC
  Filled 2014-09-29: qty 200

## 2014-09-29 MED ORDER — DEXAMETHASONE SODIUM PHOSPHATE 4 MG/ML IJ SOLN
INTRAMUSCULAR | Status: DC | PRN
  Administered 2014-09-29: 10 mg via INTRAVENOUS

## 2014-09-29 MED ORDER — CIPROFLOXACIN IN D5W 400 MG/200ML IV SOLN
INTRAVENOUS | Status: DC | PRN
Start: 2014-09-29 — End: 2014-09-29
  Administered 2014-09-29: 400 mg via INTRAVENOUS

## 2014-09-29 MED ORDER — ONDANSETRON HCL 4 MG/2ML IJ SOLN: 4.0000 mg | Freq: Four times a day (QID) | INTRAMUSCULAR | Status: DC | PRN

## 2014-09-29 MED ORDER — MIDAZOLAM HCL 5 MG/5ML IJ SOLN
INTRAMUSCULAR | Status: DC | PRN
  Administered 2014-09-29: 2 mg via INTRAVENOUS

## 2014-09-29 MED ORDER — MIDAZOLAM HCL 2 MG/2ML IJ SOLN: 1.0000 mg | INTRAMUSCULAR | Status: DC | PRN

## 2014-09-29 MED ORDER — LACTATED RINGERS IV SOLN
INTRAVENOUS | Status: DC
  Administered 2014-09-29: 10:00:00 via INTRAVENOUS

## 2014-09-29 MED ORDER — SODIUM CHLORIDE 0.9 % IJ SOLN: 3.0000 mL | INTRAMUSCULAR | Status: DC | PRN

## 2014-09-29 SURGICAL SUPPLY — 57 items
APPLIER CLIP 9.375 MED OPEN (MISCELLANEOUS)
APR CLP MED 9.3 20 MLT OPN (MISCELLANEOUS)
BINDER BREAST LRG (GAUZE/BANDAGES/DRESSINGS) IMPLANT
BINDER BREAST MEDIUM (GAUZE/BANDAGES/DRESSINGS) IMPLANT
BINDER BREAST XLRG (GAUZE/BANDAGES/DRESSINGS) ×2 IMPLANT
BINDER BREAST XXLRG (GAUZE/BANDAGES/DRESSINGS) IMPLANT
BLADE HEX COATED 2.75 (ELECTRODE) ×2 IMPLANT
BLADE SURG 10 STRL SS (BLADE) ×2 IMPLANT
BLADE SURG 15 STRL LF DISP TIS (BLADE) ×1 IMPLANT
BLADE SURG 15 STRL SS (BLADE) ×2
CANISTER SUC SOCK COL 7IN (MISCELLANEOUS) IMPLANT
CANISTER SUCT 1200ML W/VALVE (MISCELLANEOUS) IMPLANT
CHLORAPREP W/TINT 26ML (MISCELLANEOUS) ×2 IMPLANT
CLIP APPLIE 9.375 MED OPEN (MISCELLANEOUS) IMPLANT
CLIP TI LARGE 6 (CLIP) ×2 IMPLANT
CLIP TI MEDIUM 6 (CLIP) IMPLANT
COVER BACK TABLE 60X90IN (DRAPES) ×2 IMPLANT
COVER MAYO STAND STRL (DRAPES) ×2 IMPLANT
COVER PROBE W GEL 5X96 (DRAPES) ×2 IMPLANT
DECANTER SPIKE VIAL GLASS SM (MISCELLANEOUS) IMPLANT
DEVICE DUBIN W/COMP PLATE 8390 (MISCELLANEOUS) ×2 IMPLANT
DRAPE LAPAROSCOPIC ABDOMINAL (DRAPES) ×2 IMPLANT
DRAPE UTILITY XL STRL (DRAPES) ×2 IMPLANT
ELECT REM PT RETURN 9FT ADLT (ELECTROSURGICAL) ×2
ELECTRODE REM PT RTRN 9FT ADLT (ELECTROSURGICAL) ×1 IMPLANT
GLOVE BIO SURGEON STRL SZ 6 (GLOVE) ×2 IMPLANT
GLOVE BIO SURGEON STRL SZ 6.5 (GLOVE) ×4 IMPLANT
GLOVE BIOGEL PI IND STRL 6.5 (GLOVE) ×1 IMPLANT
GLOVE BIOGEL PI IND STRL 7.0 (GLOVE) ×1 IMPLANT
GLOVE BIOGEL PI INDICATOR 6.5 (GLOVE) ×1
GLOVE BIOGEL PI INDICATOR 7.0 (GLOVE) ×1
GOWN STRL REUS W/ TWL LRG LVL3 (GOWN DISPOSABLE) ×1 IMPLANT
GOWN STRL REUS W/TWL 2XL LVL3 (GOWN DISPOSABLE) ×2 IMPLANT
GOWN STRL REUS W/TWL LRG LVL3 (GOWN DISPOSABLE) ×2
KIT MARKER MARGIN INK (KITS) ×2 IMPLANT
LIQUID BAND (GAUZE/BANDAGES/DRESSINGS) ×2 IMPLANT
NEEDLE HYPO 25X1 1.5 SAFETY (NEEDLE) ×2 IMPLANT
NS IRRIG 1000ML POUR BTL (IV SOLUTION) IMPLANT
PACK BASIN DAY SURGERY FS (CUSTOM PROCEDURE TRAY) ×2 IMPLANT
PENCIL BUTTON HOLSTER BLD 10FT (ELECTRODE) ×2 IMPLANT
SLEEVE SCD COMPRESS KNEE MED (MISCELLANEOUS) ×2 IMPLANT
SPONGE GAUZE 4X4 12PLY STER LF (GAUZE/BANDAGES/DRESSINGS) ×2 IMPLANT
SPONGE LAP 18X18 X RAY DECT (DISPOSABLE) ×2 IMPLANT
STRIP CLOSURE SKIN 1/2X4 (GAUZE/BANDAGES/DRESSINGS) ×2 IMPLANT
SUT MNCRL AB 4-0 PS2 18 (SUTURE) ×2 IMPLANT
SUT MON AB 5-0 PS2 18 (SUTURE) IMPLANT
SUT SILK 2 0 SH (SUTURE) IMPLANT
SUT VIC AB 2-0 SH 27 (SUTURE) ×2
SUT VIC AB 2-0 SH 27XBRD (SUTURE) ×1 IMPLANT
SUT VIC AB 3-0 SH 27 (SUTURE) ×2
SUT VIC AB 3-0 SH 27X BRD (SUTURE) ×1 IMPLANT
SUT VIC AB 5-0 PS2 18 (SUTURE) IMPLANT
SYR CONTROL 10ML LL (SYRINGE) ×2 IMPLANT
TOWEL OR 17X24 6PK STRL BLUE (TOWEL DISPOSABLE) ×2 IMPLANT
TOWEL OR NON WOVEN STRL DISP B (DISPOSABLE) ×2 IMPLANT
TUBE CONNECTING 20X1/4 (TUBING) IMPLANT
YANKAUER SUCT BULB TIP NO VENT (SUCTIONS) IMPLANT

## 2014-09-29 NOTE — Op Note (Signed)
Right Breast Radioactive seed localized lumpectomy  Indications: This patient presents with history of abnormal mammogram and papilloma on core needle biopsy  Pre-operative Diagnosis: see above  Post-operative Diagnosis: same  Surgeon: Stark Klein   Anesthesia: General endotracheal anesthesia  ASA Class: 2  Procedure Details  The patient was seen in the Holding Room. The risks, benefits, complications, treatment options, and expected outcomes were discussed with the patient. The possibilities of bleeding, infection, the need for additional procedures, failure to diagnose a condition, and creating a complication requiring transfusion or operation were discussed with the patient. The patient concurred with the proposed plan, giving informed consent.  The site of surgery properly noted/marked. The patient was taken to Operating Room # Right, identified, and the procedure verified as Right Breast Lumpectomy. A Time Out was held and the above information confirmed.  The Right breast, and chest were prepped and draped in standard fashion. The lumpectomy was performed by creating a circumareolar incision over the lower outer quadrant of the breast over the previously placed radioactive seed.  Dissection was carried down to around the point of maximum signal intensity. The cautery was used to perform the dissection.  Hemostasis was achieved with cautery. The specimen was inked with the margin marker paint kit.    Specimen radiography confirmed inclusion of the mammographic lesion, the clip, and the seed.  The background signal in the breast was zero.  The wound was irrigated and closed with 3-0 vicryl in layers and 4-0 monocryl subcuticular suture.      Sterile dressings were applied. At the end of the operation, all sponge, instrument, and needle counts were correct.  Findings: grossly clear surgical margins and no adenopathy  Estimated Blood Loss:  min         Specimens: Right breast lumpectomy.            Complications:  None; patient tolerated the procedure well.         Disposition: PACU - hemodynamically stable.         Condition: stable

## 2014-09-29 NOTE — Discharge Instructions (Addendum)
Post Anesthesia Home Care Instructions  Activity: Get plenty of rest for the remainder of the day. A responsible adult should stay with you for 24 hours following the procedure.  For the next 24 hours, DO NOT: -Drive a car -Paediatric nurse -Drink alcoholic beverages -Take any medication unless instructed by your physician -Make any legal decisions or sign important papers.  Meals: Start with liquid foods such as gelatin or soup. Progress to regular foods as tolerated. Avoid greasy, spicy, heavy foods. If nausea and/or vomiting occur, drink only clear liquids until the nausea and/or vomiting subsides. Call your physician if vomiting continues.  Special Instructions/Symptoms: Call your surgeon if you experience:   1.  Fever over 101.0. 2.  Inability to urinate. 3.  Nausea and/or vomiting. 4.  Extreme swelling or bruising at the surgical site. 5.  Continued bleeding from the incision. 6.  Increased pain, redness or drainage from the incision. 7.  Problems related to your pain medication. 8. Any change in color, movement and/or sensation 9. Any problems and/or concerns   Breast Biopsy A breast biopsy is a procedure where a sample of breast tissue is removed from your breast. The tissue is examined under a microscope to see if cancerous cells are present. A breast biopsy is done when there is:  Any undiagnosed breast mass (tumor).  Nipple abnormalities, dimpling, crusting, or ulcerations.  Abnormal discharge from the nipple, especially blood.  Redness, swelling, and pain of the breast.  Calcium deposits (calcifications) or abnormalities seen on a mammogram, ultrasound result, or results of magnetic resonance imaging (MRI).  Suspicious changes in the breast seen on your mammogram. If the tumor is found to be cancerous (malignant), a breast biopsy can help to determine what the best treatment is for you. There are many different types of breast biopsies. Talk to your caregiver  about your options and which type is best for you.   LET YOUR CAREGIVER KNOW ABOUT:  Allergies to food or medicine.  Medicines taken, including vitamins, herbs, eyedrops, over-the-counter medicines, and creams.  Use of steroids (by mouth or creams).  Previous problems with anesthetics or numbing medicines.  History of bleeding problems or blood clots.  Previous surgery.  Other health problems, including diabetes and kidney problems.  Any recent colds or infections.  Possibility of pregnancy, if this applies. RISKS AND COMPLICATIONS   Bleeding.  Infection.  Allergy to medicines.  Bruising and swelling of the breast.  Alteration in the shape of the breast.  Not finding the lump or abnormality.  Needing more surgery. BEFORE THE PROCEDURE  Arrange for someone to drive you home after the procedure.  Do not smoke for 2 weeks before the procedure. Stop smoking, if you smoke.  Do not drink alcohol for 24 hours before procedure.  Wear a good support bra to the procedure. PROCEDURE  You may be given a medicine to numb the breast area (local anesthesia) or a medicine to make you sleep (general anesthesia) during the procedure. The following are the different types of biopsies that can be performed.   Fine-needle aspiration--A thin needle is attached to a syringe and inserted into the breast lump. Fluid and cells are removed and then looked at under a microscope. If the breast lump cannot be felt, an ultrasound may be used to help locate the lump and place the needle in the correct area.   Core needle biopsy--A wide, hollow needle (core needle) is inserted into the breast lump 3-6 times to get tissue samples  or cores. The samples are removed. The needle is usually placed in the correct area by using an ultrasound or X-ray.   Stereotactic biopsy--X-ray equipment and a computer are used to analyze X-ray pictures of the breast lump. The computer then finds exactly where the  core needle needs to be inserted. Tissue samples are removed.   Vacuum-assisted biopsy--A small incision (less than  inch) is made in your breast. A biopsy device that includes a hollow needle and vacuum is passed through the incision and into the breast tissue. The vacuum gently draws abnormal breast tissue into the needle to remove it. This type of biopsy removes a larger tissue sample than a regular core needle biopsy. No stitches are needed, and there is usually little scarring.  Ultrasound-guided core needle biopsy--A high frequency ultrasound helps guide the core needle to the area of the mass or abnormality. An incision is made to insert the needle. Tissue samples are removed.  Open biopsy--A larger incision is made in the breast. Your caregiver will attempt to remove the whole breast lump or as much as possible. AFTER THE PROCEDURE  You will be taken to the recovery area. If you are doing well and have no problems, you will be allowed to go home.  You may notice bruising on your breast. This is normal.  Your caregiver may apply a pressure dressing on your breast for 24-48 hours. A pressure dressing is a bandage that is wrapped tightly around the chest to stop fluid from collecting underneath tissues. Document Released: 09/16/2005 Document Revised: 01/11/2013 Document Reviewed: 10/17/2011 [facility] Patient Information 2015 [place], [place]. This information is not intended to replace advice given to you by your health care provider. Make sure you discuss any questions you have with your health care provider.

## 2014-09-29 NOTE — Anesthesia Postprocedure Evaluation (Signed)
Anesthesia Post Note  Patient: [name]  Procedure(s) Performed: Procedure(s) (LRB): RIGHT BREAST LUMPECTOMY WITH RADIOACTIVE SEED LOCALIZATION (Right)  Anesthesia type: General  Patient location: PACU  Post pain: Pain level controlled and Adequate analgesia  Post assessment: Post-op Vital signs reviewed, Patient's Cardiovascular Status Stable, Respiratory Function Stable, Patent Airway and Pain level controlled  Last Vitals:  Filed Vitals:   09/29/14 1230  BP: 113/68  Pulse: 113  Temp:   Resp: 19    Post vital signs: Reviewed and stable  Level of consciousness: awake, alert  and oriented  Complications: No apparent anesthesia complications

## 2014-09-29 NOTE — Anesthesia Procedure Notes (Signed)
Procedure Name: LMA Insertion Date/Time: 09/29/2014 11:30 AM Performed by: Toula Moos L Pre-anesthesia Checklist: Patient identified, Emergency Drugs available, Suction available, Patient being monitored and Timeout performed Patient Re-evaluated:Patient Re-evaluated prior to inductionOxygen Delivery Method: Circle System Utilized Preoxygenation: Pre-oxygenation with 100% oxygen Intubation Type: IV induction Ventilation: Mask ventilation without difficulty LMA: LMA inserted LMA Size: 4.0 Number of attempts: 1 Airway Equipment and Method: bite block Placement Confirmation: positive ETCO2 Tube secured with: Tape Dental Injury: Teeth and Oropharynx as per pre-operative assessment

## 2014-09-29 NOTE — Anesthesia Preprocedure Evaluation (Signed)
Anesthesia Evaluation  Patient identified by MRN, date of birth, ID band Patient awake    Reviewed: Allergy & Precautions, H&P , NPO status , Patient's Chart, lab work & pertinent test results  Airway Mallampati: II   Neck ROM: full    Dental   Pulmonary          Cardiovascular hypertension,     Neuro/Psych  Headaches,    GI/Hepatic   Endo/Other  obese  Renal/GU      Musculoskeletal   Abdominal   Peds  Hematology   Anesthesia Other Findings   Reproductive/Obstetrics                             Anesthesia Physical Anesthesia Plan  ASA: II  Anesthesia Plan: General   Post-op Pain Management:    Induction: Intravenous  Airway Management Planned: LMA  Additional Equipment:   Intra-op Plan:   Post-operative Plan:   Informed Consent: I have reviewed the patients History and Physical, chart, labs and discussed the procedure including the risks, benefits and alternatives for the proposed anesthesia with the patient or authorized representative who has indicated his/her understanding and acceptance.     Plan Discussed with: CRNA, Anesthesiologist and Surgeon  Anesthesia Plan Comments:         Anesthesia Quick Evaluation

## 2014-09-29 NOTE — Interval H&P Note (Signed)
History and Physical Interval Note:  09/29/2014 11:05 AM  [name]  has presented today for surgery, with the diagnosis of RIGHT ABNORMAL MAMMOGRAM  The various methods of treatment have been discussed with the patient and family. After consideration of risks, benefits and other options for treatment, the patient has consented to  Procedure(s): RIGHT BREAST LUMPECTOMY WITH RADIOACTIVE SEED LOCALIZATION (Right) as a surgical intervention .  The patient's history has been reviewed, patient examined, no change in status, stable for surgery.  I have reviewed the patient's chart and labs.  Questions were answered to the patient's satisfaction.     Callen Vancuren

## 2014-09-29 NOTE — H&P (Signed)
[name] Post  Location: [facility] Patient #: [id number] DOB: 1976-08-22 Single / Language: Cleophus Molt / Race: Black or African American Female  History of Present Illness Patient words: rt breast check.  The patient is a 38 year old female who presents with a complaint of nipple discharge. Pt presents with an incidental discovery of nipple discharge. She went to her GYN (Dr. Harrington Challenger) for a pap, and Dr. Harrington Challenger found the discharge on physical exam. She is referred by Dr. Augustin Coupe from radiology for consultation regarding this issue. There is no spontaneous discharge, only with squeezing the nipple. It is yellowish red. She was recommended to get a diagnostic mammogram. 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 She denies breast pain, breast mass, or other breast history. She has no family history of cancer. Menarche was at age 72. She had her first child between age 16-20. She underwent a biopsy of the lesion seen on mammogram and pathology is below. 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 - SEE MICROSCOPIC DESCRIPTION.   Other Problems  Back Pain Gastroesophageal Reflux Disease Migraine Headache  Past Surgical History Breast Biopsy Right.  Diagnostic Studies History Colonoscopy never Mammogram within last year Pap Smear 1-5 years ago  Allergies  Penicillins  Medication History Tranexamic Acid (650MG  Tablet, Oral) Active.  Social History Alcohol use Occasional alcohol use. Caffeine use Carbonated beverages, Tea. No drug use Tobacco use Never smoker.  Family History Heart disease in female family member before age 51 Hypertension Father, Mother. Migraine Headache Mother.  Pregnancy / Birth History  Age at menarche 50 years. Contraceptive  History Oral contraceptives. Gravida 2 Maternal age 54-20 Para 2  Review of Systems  General Present- Fatigue. Not Present- Appetite Loss, Chills, Fever, Night Sweats, Weight Gain and Weight Loss. Skin Present- Dryness. Not Present- Change in Wart/Mole, Hives, Jaundice, New Lesions, Non-Healing Wounds, Rash and Ulcer. HEENT Present- Seasonal Allergies. Not Present- Earache, Hearing Loss, Hoarseness, Nose Bleed, Oral Ulcers, Ringing in the Ears, Sinus Pain, Sore Throat, Visual Disturbances, Wears glasses/contact lenses and Yellow Eyes. Respiratory Not Present- Bloody sputum, Chronic Cough, Difficulty Breathing, Snoring and Wheezing. Breast Present- Breast Mass and Breast Pain. Not Present- Nipple Discharge and Skin Changes. Cardiovascular Not Present- Chest Pain, Difficulty Breathing Lying Down, Leg Cramps, Palpitations, Rapid Heart Rate, Shortness of Breath and Swelling of Extremities. Gastrointestinal Present- Indigestion. Not Present- Abdominal Pain, Bloating, Bloody Stool, Change in Bowel Habits, Chronic diarrhea, Constipation, Difficulty Swallowing, Excessive gas, Gets full quickly at meals, Hemorrhoids, Nausea, Rectal Pain and Vomiting. Female Genitourinary Present- Frequency and Urgency. Not Present- Nocturia, Painful Urination and Pelvic Pain. Musculoskeletal Present- Back Pain and Joint Stiffness. Not Present- Joint Pain, Muscle Pain, Muscle Weakness and Swelling of Extremities. Neurological Present- Headaches. Not Present- Decreased Memory, Fainting, Numbness, Seizures, Tingling, Tremor, Trouble walking and Weakness. Psychiatric Present- Change in Sleep Pattern. Not Present- Anxiety, Bipolar, Depression, Fearful and Frequent crying. Endocrine Not Present- Cold Intolerance, Excessive Hunger, Hair Changes, Heat Intolerance, Hot flashes and New Diabetes. Hematology Present- Easy Bruising. Not Present- Excessive bleeding, Gland problems, HIV and Persistent Infections.   Vitals Wt  Readings from Last 3 Encounters:  09/29/14 199 lb 4 oz (90.379 kg)  02/26/13 200 lb 14.4 oz (91.128 kg)  08/03/10 196 lb (88.905 kg)   Temp Readings from Last 3 Encounters:  09/29/14 98.2 F (  36.8 C) Oral  02/26/13 97.2 F (36.2 C) Oral  02/01/13 97.5 F (36.4 C) Oral   BP Readings from Last 3 Encounters:  09/29/14 171/100  02/26/13 150/92  02/01/13 140/92   Pulse Readings from Last 3 Encounters:  09/29/14 95  02/26/13 87  02/01/13 88     Physical Exam  General Mental Status-Alert. General Appearance-Consistent with stated age. Hydration-Well hydrated. Voice-Normal.  Head and Neck Head-normocephalic, atraumatic with no lesions or palpable masses. Trachea-midline. Thyroid Gland Characteristics - normal size and consistency.  Eye Eyeball - Bilateral-Extraocular movements intact. Sclera/Conjunctiva - Bilateral-No scleral icterus.  Chest and Lung Exam Chest and lung exam reveals -quiet, even and easy respiratory effort with no use of accessory muscles and on auscultation, normal breath sounds, no adventitious sounds and normal vocal resonance. Inspection Chest Wall - Normal. Back - normal.  Breast Note: Breasts are symmetric bilaterally. Left breast is slightly smaller. She has a small skin lesion at 2 oclock on the left breast near the anterior axillary line that feels like a small sebaceous cyst. She has serosanguinous discharge of her right nipple with squeezing. There are no masses or LAD. She has no skin dimpling or lymphadenopathy.   Cardiovascular Cardiovascular examination reveals -normal heart sounds, regular rate and rhythm with no murmurs and normal pedal pulses bilaterally.  Abdomen Inspection Inspection of the abdomen reveals - No Hernias. Palpation/Percussion Palpation and Percussion of the abdomen reveal - Soft, Non Tender, No Rebound tenderness, No Rigidity (guarding) and No hepatosplenomegaly. Auscultation Auscultation of  the abdomen reveals - Bowel sounds normal.  Neurologic Neurologic evaluation reveals -alert and oriented x 3 with no impairment of recent or remote memory. Mental Status-Normal.  Musculoskeletal Global Assessment -Note: no gross deformities.  Normal Exam - Left-Upper Extremity Strength Normal and Lower Extremity Strength Normal. Normal Exam - Right-Upper Extremity Strength Normal and Lower Extremity Strength Normal.  Lymphatic Head & Neck  General Head & Neck Lymphatics: Bilateral - Description - Normal. Axillary  General Axillary Region: Bilateral - Description - Normal. Tenderness - Non Tender. Femoral & Inguinal  Generalized Femoral & Inguinal Lymphatics: Bilateral - Description - No Generalized lymphadenopathy.    Assessment & Plan ABNORMAL MAMMOGRAM OF RIGHT BREAST (793.80  R92.8) Impression: We will plan a seed localized lumpectomy for the papilloma to make sure she has no DCIS associated with it. We discussed surgery including location of the incision, risks, what to expect wtih the seed placement, need for help post op and post op restrictions.  I reviewed the risks of bleeding, infection, need for additional procedures, the possibility of finding cancer, and the risk of seroma.  She understands and wishes to proceed. Current Plans  Schedule for Surgery Pt Education - CSS Breast Biopsy Instructions (FLB): discussed with patient and provided information.

## 2014-09-29 NOTE — Transfer of Care (Signed)
Immediate Anesthesia Transfer of Care Note  Patient: [name]  Procedure(s) Performed: Procedure(s): RIGHT BREAST LUMPECTOMY WITH RADIOACTIVE SEED LOCALIZATION (Right)  Patient Location: PACU  Anesthesia Type:General  Level of Consciousness: awake and patient cooperative  Airway & Oxygen Therapy: Patient Spontanous Breathing and Patient connected to face mask oxygen  Post-op Assessment: Report given to PACU RN and Post -op Vital signs reviewed and stable  Post vital signs: Reviewed and stable  Complications: No apparent anesthesia complications

## 2014-10-03 ENCOUNTER — Encounter (HOSPITAL_BASED_OUTPATIENT_CLINIC_OR_DEPARTMENT_OTHER): Payer: Self-pay | Admitting: General Surgery

## 2014-10-04 ENCOUNTER — Telehealth (INDEPENDENT_AMBULATORY_CARE_PROVIDER_SITE_OTHER): Payer: Self-pay

## 2014-10-04 NOTE — Telephone Encounter (Signed)
Pt made aware of benign pathology results.

## 2014-10-04 NOTE — Progress Notes (Signed)
Quick Note:  Please let patient know that there is no evidence of cancer. Pathology is benign. ______

## 2015-12-25 ENCOUNTER — Emergency Department (HOSPITAL_COMMUNITY)
Admission: EM | Admit: 2015-12-25 | Discharge: 2015-12-26 | Disposition: A | Discharge status: Home / Self Care | Payer: Self-pay | Attending: Physician Assistant | Admitting: Physician Assistant

## 2015-12-25 ENCOUNTER — Encounter (HOSPITAL_COMMUNITY): Payer: Self-pay | Admitting: Emergency Medicine

## 2015-12-25 ENCOUNTER — Emergency Department (HOSPITAL_COMMUNITY): Payer: Self-pay

## 2015-12-25 DIAGNOSIS — W06XXXA Fall from bed, initial encounter: Secondary | ICD-10-CM | POA: Insufficient documentation

## 2015-12-25 DIAGNOSIS — Z880 Allergy status to penicillin: Secondary | ICD-10-CM | POA: Insufficient documentation

## 2015-12-25 DIAGNOSIS — Z8679 Personal history of other diseases of the circulatory system: Secondary | ICD-10-CM | POA: Insufficient documentation

## 2015-12-25 DIAGNOSIS — S63259A Unspecified dislocation of unspecified finger, initial encounter: Secondary | ICD-10-CM

## 2015-12-25 DIAGNOSIS — Y9389 Activity, other specified: Secondary | ICD-10-CM | POA: Insufficient documentation

## 2015-12-25 DIAGNOSIS — W19XXXA Unspecified fall, initial encounter: Secondary | ICD-10-CM

## 2015-12-25 DIAGNOSIS — S63293A Dislocation of distal interphalangeal joint of left middle finger, initial encounter: Secondary | ICD-10-CM | POA: Insufficient documentation

## 2015-12-25 DIAGNOSIS — Y998 Other external cause status: Secondary | ICD-10-CM | POA: Insufficient documentation

## 2015-12-25 DIAGNOSIS — Y9289 Other specified places as the place of occurrence of the external cause: Secondary | ICD-10-CM | POA: Insufficient documentation

## 2015-12-25 NOTE — ED Notes (Signed)
Pt. presents with left distal middle finger pain/deformity injured this evening after she fell from her bed , denies LOC / ambulatory .

## 2015-12-25 NOTE — ED Provider Notes (Signed)
CSN: [id number]     Arrival date & time 12/25/15  2156 History  By signing my name below, I, Jolayne Panther, attest that this documentation has been prepared under the direction and in the presence of Comer Locket, PA-C. Electronically Signed: Jolayne Panther, Scribe. 12/25/2015. 12:00 AM.   Chief Complaint  Patient presents with  . Finger Injury   The history is provided by the patient. No language interpreter was used.    HPI Comments: [name] is a 40 y.o. female who presents to the Emergency Department complaining of sudden onset of Sharp pain and a deformity to the distal aspect of her left, middle finger, which she sustained earlier this evening 2.5 hours ago when she fell off of her bed, no LOC. No interventions tried to improve symptoms. Movement palpation worsening discomfort. Pt is otherwise healthy and has no other complaints at this time.   Past Medical History  Diagnosis Date  . Migraines   . Medical history non-contributory    Past Surgical History  Procedure Laterality Date  . No past surgeries    . Breast lumpectomy with radioactive seed localization Right 09/29/2014    Procedure: RIGHT BREAST LUMPECTOMY WITH RADIOACTIVE SEED LOCALIZATION;  Surgeon: Stark Klein, MD;  Location: [place];  Service: General;  Laterality: Right;   Family History  Problem Relation Age of Onset  . Hyperlipidemia Mother   . Hypertension Father    Social History  Substance Use Topics  . Smoking status: Never Smoker   . Smokeless tobacco: None  . Alcohol Use: No   OB History    Gravida Para Term Preterm AB TAB SAB Ectopic Multiple Living   2 2 2             Review of Systems A 10 point review of systems was completed and was negative except for pertinent positives and negatives as mentioned in the history of present illness Allergies  Penicillins  Home Medications   Prior to Admission medications   Medication Sig Start Date End Date Taking?  Authorizing Provider  aspirin-acetaminophen-caffeine (EXCEDRIN MIGRAINE) [id number] MG per tablet Take 1 tablet by mouth every 6 (six) hours as needed for pain (migraines).    Historical Provider, MD  famotidine (PEPCID AC) 10 MG tablet Take 10 mg by mouth 2 (two) times daily.    Historical Provider, MD  oxyCODONE-acetaminophen (ROXICET) 5-325 MG per tablet Take 1-2 tablets by mouth every 4 (four) hours as needed for severe pain. 09/29/14   Stark Klein, MD  tranexamic acid (LYSTEDA) 650 MG TABS tablet Take 1,300 mg by mouth 3 (three) times daily.    Historical Provider, MD   BP 154/94 mmHg  Pulse 97  Temp(Src) 97.9 F (36.6 C) (Oral)  Resp 16  Ht 5\' 4"  (1.626 m)  Wt 92.08 kg  BMI 34.83 kg/m2  SpO2 100%  LMP 12/04/2015 (Approximate) Physical Exam  Constitutional: She is oriented to person, place, and time. She appears well-developed and well-nourished. No distress.  HENT:  Head: Normocephalic.  Eyes: Conjunctivae are normal.  Cardiovascular: Normal rate.   Pulmonary/Chest: Effort normal.  Abdominal: She exhibits no distension.  Musculoskeletal:  Left hand: middle finger deformity noted on distal phalanx, hyperextension at DIP joint. Sensation is intact to light touch, brisk cap refill.   Neurological: She is alert and oriented to person, place, and time.  Skin: Skin is warm and dry.  Psychiatric: She has a normal mood and affect.  Nursing note and vitals reviewed.  ED Course  Reduction of dislocation Date/Time: 12/26/2015 1:26 AM Performed by: Comer Locket Authorized by: Comer Locket Consent: Verbal consent obtained. Consent given by: patient Patient understanding: patient states understanding of the procedure being performed Patient identity confirmed: verbally with patient Time out: Immediately prior to procedure a "time out" was called to verify the correct patient, procedure, equipment, support staff and site/side marked as required. Local anesthesia used:  yes Anesthesia: digital block Local anesthetic: lidocaine 2% with epinephrine Patient sedated: no Patient tolerance: Patient tolerated the procedure well with no immediate complications Comments: Successful reduction of distal phalanx at DIP joint    SPLINT APPLICATION Date/Time: [id number] AM Authorized by: Verl Dicker Consent: Verbal consent obtained. Risks and benefits: risks, benefits and alternatives were discussed Consent given by: patient Splint applied by: Nursing staff Location details: Left middle finger  Splint type: Static finger splint  Supplies used: Static finger splint  Post-procedure: The splinted body part was neurovascularly unchanged following the procedure. Patient tolerance: Patient tolerated the procedure well with no immediate complications.     DIAGNOSTIC STUDIES:    Oxygen Saturation is 100% on RA, normal by my interpretation.   COORDINATION OF CARE:  11:59 PM Will order x-ray of left middle finger and will administer pain medication. Discussed treatment plan with pt at bedside and pt agreed to plan.   Imaging Review Dg Finger Middle Left  12/26/2015  CLINICAL DATA:  Status post fall out of bed. Landed on hand, with left middle finger pain. Initial encounter. EXAM: LEFT MIDDLE FINGER 2+V COMPARISON:  None. FINDINGS: There is dorsal dislocation of the third distal phalanx, lodged against the distal aspect of the third middle phalanx. Mild ulnar angulation is also noted. No definite fracture is seen at this time. Surrounding soft tissue swelling is noted. IMPRESSION: Dorsal dislocation of the third distal phalanx, lodged against the distal aspect of the third middle phalanx. Mild ulnar angulation also noted. No definite fracture characterized. Electronically Signed   By: Garald Balding M.D.   On: 12/26/2015 00:11   I have personally reviewed and evaluated these images as part of my medical decision-making.  Filed Vitals:   12/25/15 2235 12/25/15 2246   BP: 154/94   Pulse: 97   Temp: 97.9 F (36.6 C)   TempSrc: Oral   Resp: 16   Height:  5\' 4"  (1.626 m)  Weight:  92.08 kg  SpO2: 100%     MDM  [name] is a 40 y.o. female patient presents for evaluation of left third digit distal phalanx dislocation after falling out of bed. Remains neurovascularly intact. Dislocation confirmed on x-ray. Reduction performed at bedside by myself without apparent complication. Placed in finger splint, discussed follow-up with PCP this week for reevaluation as needed. Strict return precautions discussed. She verbalizes understanding and agrees with this plan and subsequent discharge.  Final diagnoses:  Finger dislocation, initial encounter    I personally performed the services described in this documentation, which was scribed in my presence. The recorded information has been reviewed and is accurate.    Comer Locket, PA-C 12/26/15 [place], MD 12/26/15 [phone]

## 2015-12-26 MED ORDER — HYDROCODONE-ACETAMINOPHEN 5-325 MG PO TABS
2.0000 | ORAL_TABLET | Freq: Once | ORAL | Status: AC
  Administered 2015-12-26: 2 via ORAL
  Filled 2015-12-26: qty 2

## 2015-12-26 MED ORDER — LIDOCAINE HCL 2 % IJ SOLN
15.0000 mL | Freq: Once | INTRAMUSCULAR | Status: AC
  Administered 2015-12-26: 300 mg via INTRADERMAL
  Filled 2015-12-26: qty 20

## 2015-12-26 NOTE — Discharge Instructions (Signed)
Wear your splint when active over the next week. Follow-up with your doctor as needed for reevaluation. If your symptoms do not improve, he may follow-up with orthopedics. Return to ED for any new or worsening symptoms as we discussed.  Finger Dislocation  A finger dislocation happens when your finger bones separate from where they connect with each other. It usually happens to the joint closest to your knuckle (proximal interphalangeal joint). Your doctor will put your bones back in the joint. This may be done by pulling on your finger or through surgery. A bandage (dressing) or splint will be placed around your joint. The bandage or splint holds your finger in place while it heals. HOME CARE   Rest your injured joint. Do not move it until told to do so.  Put ice on your injured joint as told by your doctor.  Put ice in a plastic bag.  Place a towel between your skin and the bag.  Leave the ice on for 15-20 minutes at a time. Do this every 2 hours while you are awake.  Raise (elevate) your hand above your heart as told by your doctor.  Only take medicines as told by your doctor. GET HELP RIGHT AWAY IF:  Your bandage or splint becomes damaged.  Your pain becomes worse, not better.  You lose feeling in your finger or it becomes cold or white. MAKE SURE YOU:  Understand these instructions.  Will watch your condition.  Will get help right away if you are not doing well or get worse.   This information is not intended to replace advice given to you by your health care provider. Make sure you discuss any questions you have with your health care provider.   Document Released: 09/05/2011 Document Revised: 10/07/2014 Document Reviewed: 02/10/2015 Elsevier Interactive Patient Education [employer].

## 2016-08-28 IMAGING — CR DG FINGER MIDDLE 2+V*L*
3 series · 4 of 4 positions shown · non-contrast
Comparison: None.

CLINICAL DATA: Status post fall out of bed. Landed on hand, with
left middle finger pain. Initial encounter.

EXAM:
LEFT MIDDLE FINGER 2+V

[Series 1: finger ap · 0.14mm/px · 2 of 2 slices shown]
[im 1/2]
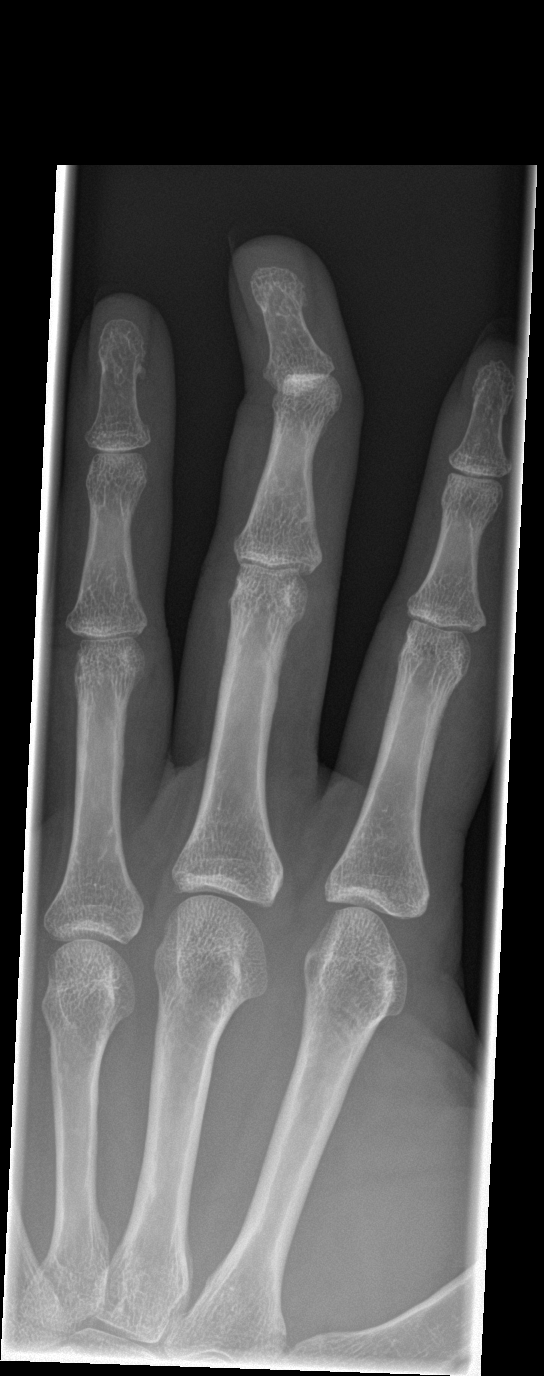
[im 2/2]
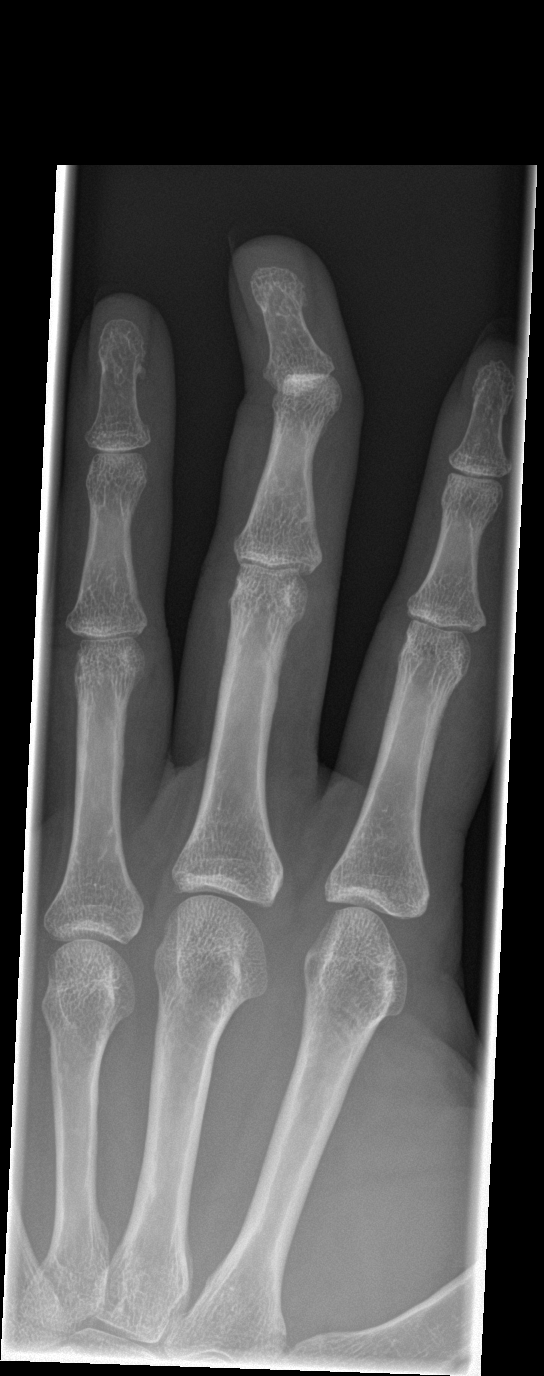

[finger obl]
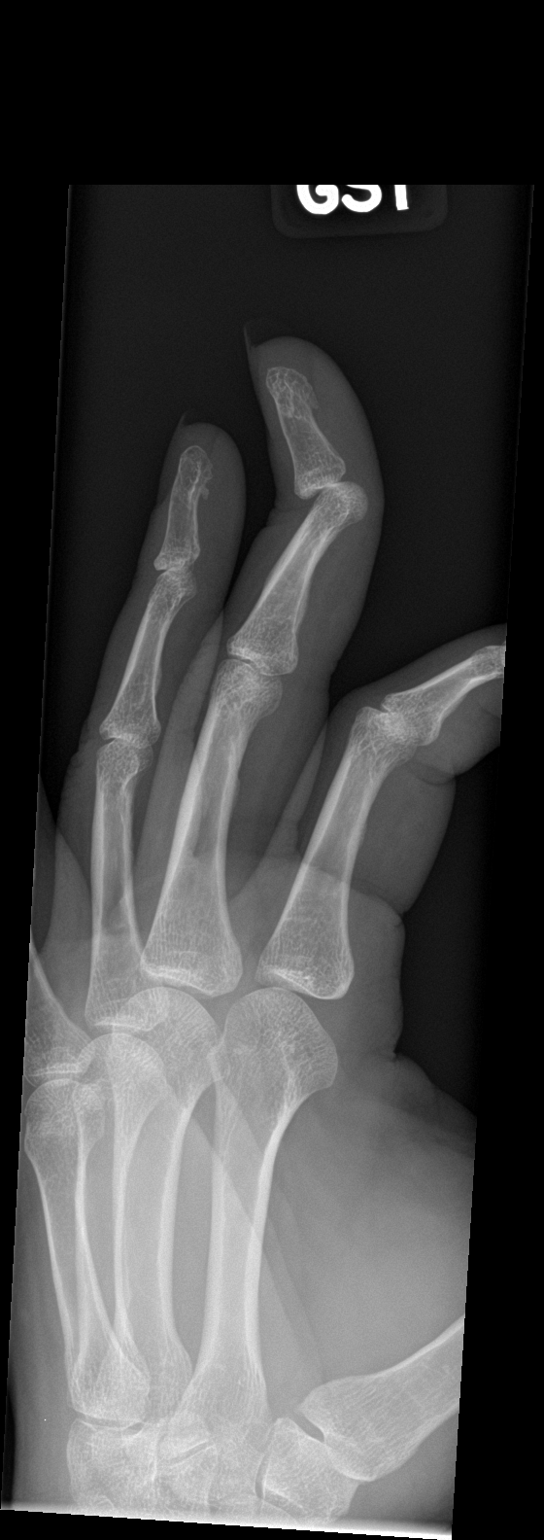

[finger lat]
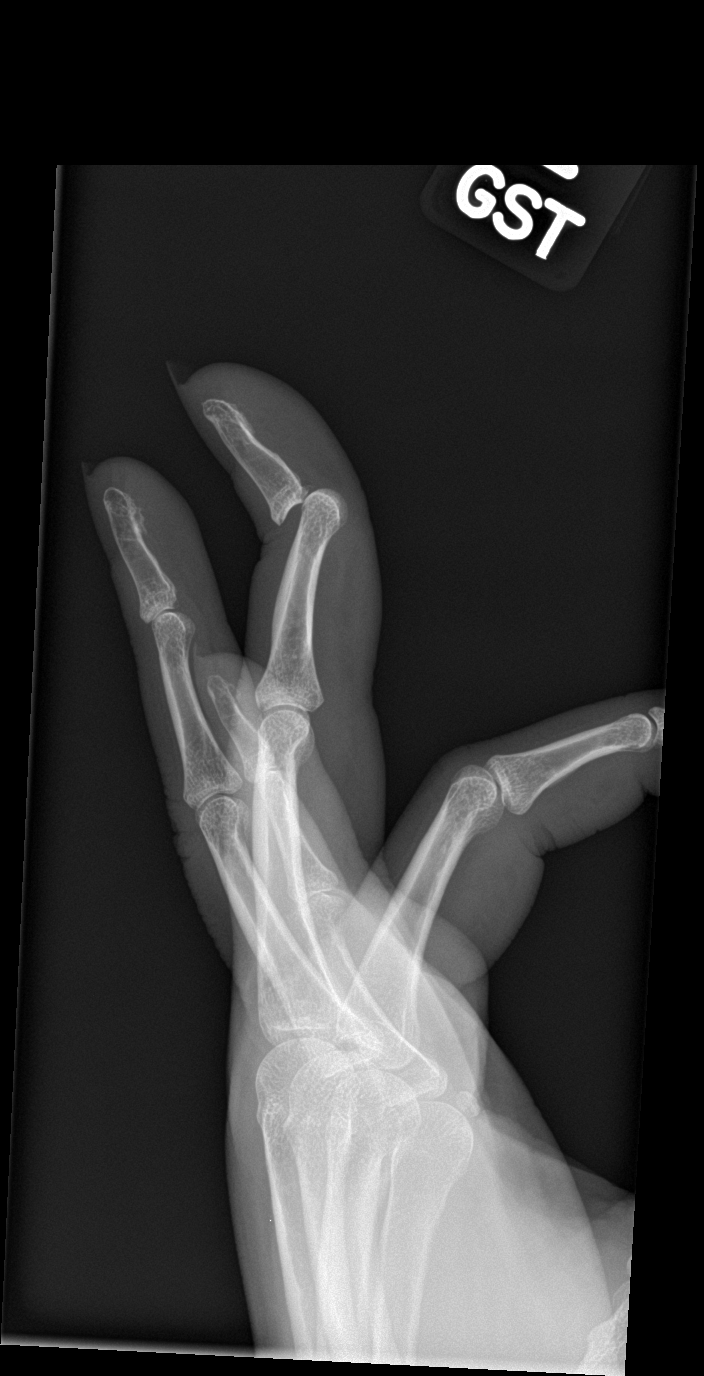

[4 of 4 positions shown; findings below may reference images not displayed]

FINDINGS: There is dorsal dislocation of the third distal phalanx, lodged
against the distal aspect of the third middle phalanx. Mild ulnar
angulation is also noted. No definite fracture is seen at this time.
Surrounding soft tissue swelling is noted.
IMPRESSION: Dorsal dislocation of the third distal phalanx, lodged against the
distal aspect of the third middle phalanx. Mild ulnar angulation
also noted. No definite fracture characterized.

## 2016-11-18 ENCOUNTER — Other Ambulatory Visit: Payer: Self-pay | Admitting: Obstetrics and Gynecology

## 2016-11-18 DIAGNOSIS — N6310 Unspecified lump in the right breast, unspecified quadrant: Secondary | ICD-10-CM

## 2016-11-21 ENCOUNTER — Ambulatory Visit
Admission: RE | Admit: 2016-11-21 | Discharge: 2016-11-21 | Disposition: A | Discharge status: Home / Self Care | Payer: Managed Care, Other (non HMO) | Source: Ambulatory Visit | Attending: Obstetrics and Gynecology | Admitting: Obstetrics and Gynecology

## 2016-11-21 ENCOUNTER — Other Ambulatory Visit: Payer: Self-pay | Admitting: Obstetrics and Gynecology

## 2016-11-21 DIAGNOSIS — N6310 Unspecified lump in the right breast, unspecified quadrant: Secondary | ICD-10-CM

## 2016-11-25 ENCOUNTER — Other Ambulatory Visit: Payer: Managed Care, Other (non HMO)

## 2016-11-25 ENCOUNTER — Other Ambulatory Visit: Payer: Self-pay | Admitting: Obstetrics and Gynecology

## 2016-11-25 DIAGNOSIS — N6310 Unspecified lump in the right breast, unspecified quadrant: Secondary | ICD-10-CM

## 2016-11-26 ENCOUNTER — Ambulatory Visit
Admission: RE | Admit: 2016-11-26 | Discharge: 2016-11-26 | Disposition: A | Discharge status: Home / Self Care | Payer: Managed Care, Other (non HMO) | Source: Ambulatory Visit | Attending: Obstetrics and Gynecology | Admitting: Obstetrics and Gynecology

## 2016-11-26 DIAGNOSIS — N6310 Unspecified lump in the right breast, unspecified quadrant: Secondary | ICD-10-CM

## 2016-12-10 ENCOUNTER — Other Ambulatory Visit: Payer: Self-pay | Admitting: Obstetrics and Gynecology

## 2016-12-29 HISTORY — PX: BREAST SURGERY: SHX581

## 2017-04-09 ENCOUNTER — Other Ambulatory Visit: Payer: Self-pay | Admitting: Obstetrics and Gynecology

## 2017-04-17 NOTE — H&P (Signed)
[name] is a 41 y.o. P: 2-0-0-2 who presents for hysterectomy because of menorrhagia and symptomatic uterine fibroids. The patient was diagnosed with uterine fibroids in 2014 but in the past year her periods have become progressively heavy with clots.  Menstrual flow lasts for 6 days with pad change hourly most days and accompanied by clots.  Her cramping is rated as 6/10 on a 10 point pain scale but fortunately she finds relief with over the counter NSAIDs.  Her hemoglobin has gotten as low as 7.7 however,  in April of this year hemoglobin/hematocrit = 10/32.6.  Both TSH and Prolactin levels were normal at that time. She goes on to report some intermenstrual bleeding, lower back pain occasional stress urinary incontinence symptoms and constipation but denies dysuria, frequency, urgency or dyspareunia.   A sono-hysterogram was attempted in April 2018 but due to the presence of a submucosal fibroid,  the uterine cavity would not expand.  The scan showed: anteverted uterus: 15.59 x 13.56 x 11.34 cm, endometrium: 9.88 mm with #6 fibroids: anterior intramural with a submucosal component: 8.38 cm; posterior fundal intramural: 7.93 cm; lower uterine segment intramural: 2.29 cm; right fundal pedunculated: 8.20 cm; left intramural: 4.40 cm and left fundal intramural: 4.11 cm. A cervical polyp was noted and removed at this time with the pathology showing benign cells.  An endometrial biopsy in March 2018 returned benign cells with no atypia, malignancy or hyperplasia.  Throughout the course of her bleeding symptoms the patient did not choose to take any medication for management.  A review of medical and surgical management options were given to the patient, however,  she has chosen to proceed with definitive therapy in the form of hysterectomy.   Past Medical History  OB History: G: 2;  P: 2-0-0-2;  SVB 1995 and 2002  GYN History: menarche: 41 YO    LMP: 04/07/17    Contracepton no method  The patient reports  a past history of: trichomonas.  Denies history of abnormal PAP smear.   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 Penicillins Hives    Denies sensitivity to peanuts, shellfish, soy, latex or adhesives.  ROS: Admits to vision changes with distant vision, occasional headache, shortness of breath with exertion, constipation  and occasional stress urinary incontinence symptoms but  denies  nasal congestion, dysphagia, tinnitus, dizziness, hoarseness, cough,  chest pain,  nausea, vomiting, diarrhea,  urinary frequency, urgency  dysuria, hematuria, vaginitis symptoms, pelvic pain, swelling of joints,easy bruising,  myalgias, arthralgias, skin rashes, unexplained weight loss and except as is mentioned in the history of present illness, patient's review of systems is otherwise negative.   Physical Exam  Bp: 116/80   R: 20  P: 96 bpm   Temperature: 98 degrees F orally  Weight: 209 lbs.  Height: 5'5"   BMI: 34.8  Neck: supple without masses or thyromegaly Lungs: clear to auscultation Heart: regular rate and rhythm Abdomen: firm mass from pelvis to 4 fingers above symphysis pubis  and no organomegaly Pelvic:EGBUS- wnl; vagina-normal rugae; uterus-18-20 weeks  size, irregular, cervix without lesions  or motion tenderness; adnexae-no tenderness or masses Extremities:  no clubbing, cyanosis or edema   Assesment: Menorrhagia                      Uterine Fibroids                      History of Anemia   Disposition:  Reviewed the  risks of surgery to include, but not limited to: reaction to anesthesia, damage to adjacent organs, infection and excessive bleeding. The patient verbalized understanding of these risks and has consented to proceed with a Total Abdominal Hysterectomy, Bilateral Salpingectomy and Possible Cystoscopy at [place] on 05/08/17 @ 1:30 p.m.   CSN# [id number]   Christphor Groft J. Florene Glen, PA-C  for Dr. Harvie Bridge. Mancel Bale

## 2017-04-24 NOTE — Patient Instructions (Addendum)
Your procedure is scheduled on:  Thursday, August 9  Enter through the [employer] at: [place] up the phone at the desk and dial [phone].  Call this number if you have problems the morning of surgery: [phone].  Remember: Do NOT eat food after Midnight Wednesday  Do NOT drink clear liquids (including water) after 7 am Thursday, day of surgery  Take these medicines the morning of surgery with a SIP OF WATER:  pepcid  Do NOT wear jewelry (body piercing), metal hair clips/bobby pins, make-up, or nail polish. Do NOT wear lotions, powders, or perfumes.  You may wear deoderant. Do NOT shave for 48 hours prior to surgery. Do NOT bring valuables to the hospital. .  Leave suitcase in car.  After surgery it may be brought to your room.  For patients admitted to the hospital, checkout time is 11:00 AM the day of discharge. Have a responsible adult drive you home and stay with you for 24 hours after your procedure.  Home with parents [name] [phone].

## 2017-04-29 ENCOUNTER — Encounter (HOSPITAL_COMMUNITY): Payer: Self-pay

## 2017-04-29 ENCOUNTER — Encounter (HOSPITAL_COMMUNITY)
Admission: RE | Admit: 2017-04-29 | Discharge: 2017-04-29 | Disposition: A | Discharge status: Home / Self Care | Payer: Managed Care, Other (non HMO) | Source: Ambulatory Visit | Attending: Obstetrics and Gynecology | Admitting: Obstetrics and Gynecology

## 2017-04-29 DIAGNOSIS — Z01818 Encounter for other preprocedural examination: Secondary | ICD-10-CM | POA: Insufficient documentation

## 2017-04-29 DIAGNOSIS — D259 Leiomyoma of uterus, unspecified: Secondary | ICD-10-CM | POA: Diagnosis present

## 2017-04-29 HISTORY — DX: Gastro-esophageal reflux disease without esophagitis: K21.9

## 2017-04-29 HISTORY — DX: Anemia, unspecified: D64.9

## 2017-04-29 HISTORY — DX: Dyspnea, unspecified: R06.00

## 2017-04-29 LAB — CBC
HCT: 29.4 % — ABNORMAL LOW (ref 36.0–46.0)
Hemoglobin: 9.2 g/dL — ABNORMAL LOW (ref 12.0–15.0)
MCH: 24.6 pg — ABNORMAL LOW (ref 26.0–34.0)
MCHC: 31.3 g/dL (ref 30.0–36.0)
MCV: 78.6 fL (ref 78.0–100.0)
PLATELETS: 337 10*3/uL (ref 150–400)
RBC: 3.74 MIL/uL — ABNORMAL LOW (ref 3.87–5.11)
RDW: 15.2 % (ref 11.5–15.5)
WBC: 7.3 10*3/uL (ref 4.0–10.5)

## 2017-04-29 LAB — BASIC METABOLIC PANEL
ANION GAP: 7 (ref 5–15)
BUN: 13 mg/dL (ref 6–20)
CO2: 24 mmol/L (ref 22–32)
CREATININE: 0.68 mg/dL (ref 0.44–1.00)
Calcium: 8.6 mg/dL — ABNORMAL LOW (ref 8.9–10.3)
Chloride: 105 mmol/L (ref 101–111)
GFR calc Af Amer: >60 mL/min (ref >60)
Glucose, Bld: 78 mg/dL (ref 65–99)
Potassium: 3.5 mmol/L (ref 3.5–5.1)
Sodium: 136 mmol/L (ref 135–145)

## 2017-05-08 ENCOUNTER — Inpatient Hospital Stay (HOSPITAL_COMMUNITY)
Admission: RE | Admit: 2017-05-08 | Discharge: 2017-05-10 | DRG: 743 | Disposition: A | Discharge status: Home / Self Care | Payer: Managed Care, Other (non HMO) | Source: Ambulatory Visit | Attending: Obstetrics and Gynecology | Admitting: Obstetrics and Gynecology

## 2017-05-08 ENCOUNTER — Inpatient Hospital Stay (HOSPITAL_COMMUNITY): Payer: Managed Care, Other (non HMO) | Admitting: Anesthesiology

## 2017-05-08 ENCOUNTER — Encounter (HOSPITAL_COMMUNITY)
Admission: RE | Disposition: A | Discharge status: Home / Self Care | Payer: Self-pay | Source: Ambulatory Visit | Attending: Obstetrics and Gynecology

## 2017-05-08 ENCOUNTER — Encounter (HOSPITAL_COMMUNITY): Payer: Self-pay

## 2017-05-08 DIAGNOSIS — D250 Submucous leiomyoma of uterus: Secondary | ICD-10-CM | POA: Diagnosis present

## 2017-05-08 DIAGNOSIS — K219 Gastro-esophageal reflux disease without esophagitis: Secondary | ICD-10-CM | POA: Diagnosis present

## 2017-05-08 DIAGNOSIS — D649 Anemia, unspecified: Secondary | ICD-10-CM | POA: Diagnosis present

## 2017-05-08 DIAGNOSIS — N920 Excessive and frequent menstruation with regular cycle: Secondary | ICD-10-CM | POA: Diagnosis present

## 2017-05-08 DIAGNOSIS — Z880 Allergy status to penicillin: Secondary | ICD-10-CM

## 2017-05-08 DIAGNOSIS — D251 Intramural leiomyoma of uterus: Secondary | ICD-10-CM | POA: Diagnosis present

## 2017-05-08 DIAGNOSIS — D259 Leiomyoma of uterus, unspecified: Secondary | ICD-10-CM | POA: Diagnosis present

## 2017-05-08 HISTORY — PX: CYSTOSCOPY: SHX5120

## 2017-05-08 HISTORY — PX: HYSTERECTOMY ABDOMINAL WITH SALPINGECTOMY: SHX6725

## 2017-05-08 LAB — CBC
HCT: 28.7 % — ABNORMAL LOW (ref 36.0–46.0)
HCT: 27.7 % — ABNORMAL LOW (ref 36.0–46.0)
HEMOGLOBIN: 9 g/dL — AB (ref 12.0–15.0)
Hemoglobin: 8.8 g/dL — ABNORMAL LOW (ref 12.0–15.0)
MCH: 24.5 pg — ABNORMAL LOW (ref 26.0–34.0)
MCH: 24.7 pg — ABNORMAL LOW (ref 26.0–34.0)
MCHC: 31.4 g/dL (ref 30.0–36.0)
MCHC: 31.8 g/dL (ref 30.0–36.0)
MCV: 78 fL (ref 78.0–100.0)
MCV: 77.8 fL — AB (ref 78.0–100.0)
PLATELETS: 367 10*3/uL (ref 150–400)
PLATELETS: 333 10*3/uL (ref 150–400)
RBC: 3.68 MIL/uL — ABNORMAL LOW (ref 3.87–5.11)
RBC: 3.56 MIL/uL — AB (ref 3.87–5.11)
RDW: 15.1 % (ref 11.5–15.5)
RDW: 15 % (ref 11.5–15.5)
WBC: 18.4 10*3/uL — ABNORMAL HIGH (ref 4.0–10.5)
WBC: 14.6 10*3/uL — AB (ref 4.0–10.5)

## 2017-05-08 LAB — PREPARE RBC (CROSSMATCH)

## 2017-05-08 LAB — PREGNANCY, URINE: Preg Test, Ur: NEGATIVE

## 2017-05-08 LAB — ABO/RH: ABO/RH(D): O POS

## 2017-05-08 SURGERY — HYSTERECTOMY, TOTAL, ABDOMINAL, WITH SALPINGECTOMY
Anesthesia: General | Site: Bladder

## 2017-05-08 MED ORDER — DOCUSATE SODIUM 100 MG PO CAPS
100.0000 mg | ORAL_CAPSULE | Freq: Two times a day (BID) | ORAL | Status: DC
  Administered 2017-05-09 – 2017-05-10 (×3): 100 mg via ORAL
  Filled 2017-05-08 (×3): qty 1

## 2017-05-08 MED ORDER — FENTANYL CITRATE (PF) 100 MCG/2ML IJ SOLN
25.0000 ug | INTRAMUSCULAR | Status: DC | PRN
  Administered 2017-05-08 (×4): 25 ug via INTRAVENOUS

## 2017-05-08 MED ORDER — ONDANSETRON HCL 4 MG PO TABS: 4.0000 mg | ORAL_TABLET | Freq: Three times a day (TID) | ORAL | Status: DC | PRN

## 2017-05-08 MED ORDER — SODIUM CHLORIDE 0.9 % IJ SOLN
INTRAMUSCULAR | Status: AC
  Filled 2017-05-08: qty 10

## 2017-05-08 MED ORDER — NALOXONE HCL 0.4 MG/ML IJ SOLN: 0.4000 mg | INTRAMUSCULAR | Status: DC | PRN

## 2017-05-08 MED ORDER — PROPOFOL 10 MG/ML IV BOLUS
INTRAVENOUS | Status: DC | PRN
  Administered 2017-05-08: 170 mg via INTRAVENOUS
  Administered 2017-05-08: 30 mg via INTRAVENOUS

## 2017-05-08 MED ORDER — FAMOTIDINE 20 MG PO TABS: 10.0000 mg | ORAL_TABLET | Freq: Two times a day (BID) | ORAL | Status: DC | PRN

## 2017-05-08 MED ORDER — GABAPENTIN 300 MG PO CAPS: 300.0000 mg | ORAL_CAPSULE | Freq: Once | ORAL | Status: DC

## 2017-05-08 MED ORDER — METHYLENE BLUE 0.5 % INJ SOLN
INTRAVENOUS | Status: DC | PRN
  Administered 2017-05-08 (×5): 2 mL via INTRAVENOUS

## 2017-05-08 MED ORDER — HYDROMORPHONE 1 MG/ML IV SOLN
INTRAVENOUS | Status: DC
  Administered 2017-05-08: 20:00:00 via INTRAVENOUS
  Administered 2017-05-08: 2.4 mg via INTRAVENOUS
  Administered 2017-05-09: 2.7 mg via INTRAVENOUS
  Filled 2017-05-08: qty 25

## 2017-05-08 MED ORDER — SUGAMMADEX SODIUM 200 MG/2ML IV SOLN
INTRAVENOUS | Status: AC
  Filled 2017-05-08: qty 2

## 2017-05-08 MED ORDER — FENTANYL CITRATE (PF) 100 MCG/2ML IJ SOLN
INTRAMUSCULAR | Status: AC
  Administered 2017-05-08: 25 ug via INTRAVENOUS
  Filled 2017-05-08: qty 2

## 2017-05-08 MED ORDER — KETOROLAC TROMETHAMINE 30 MG/ML IJ SOLN
INTRAMUSCULAR | Status: AC
  Filled 2017-05-08: qty 1

## 2017-05-08 MED ORDER — LIDOCAINE HCL (CARDIAC) 20 MG/ML IV SOLN
INTRAVENOUS | Status: DC | PRN
  Administered 2017-05-08: 100 mg via INTRAVENOUS

## 2017-05-08 MED ORDER — ONDANSETRON HCL 4 MG/2ML IJ SOLN: 4.0000 mg | Freq: Four times a day (QID) | INTRAMUSCULAR | Status: DC | PRN

## 2017-05-08 MED ORDER — SCOPOLAMINE 1 MG/3DAYS TD PT72
MEDICATED_PATCH | TRANSDERMAL | Status: AC
  Administered 2017-05-08: 1.5 mg via TRANSDERMAL
  Filled 2017-05-08: qty 1

## 2017-05-08 MED ORDER — ONDANSETRON HCL 4 MG/2ML IJ SOLN
INTRAMUSCULAR | Status: AC
Start: 2017-05-08 — End: 2017-05-08
  Filled 2017-05-08: qty 2

## 2017-05-08 MED ORDER — DIPHENHYDRAMINE HCL 50 MG/ML IJ SOLN: 12.5000 mg | Freq: Four times a day (QID) | INTRAMUSCULAR | Status: DC | PRN

## 2017-05-08 MED ORDER — FENTANYL CITRATE (PF) 100 MCG/2ML IJ SOLN
INTRAMUSCULAR | Status: DC | PRN
  Administered 2017-05-08: 50 ug via INTRAVENOUS
  Administered 2017-05-08 (×3): 100 ug via INTRAVENOUS
  Administered 2017-05-08: 50 ug via INTRAVENOUS
  Administered 2017-05-08: 100 ug via INTRAVENOUS
  Administered 2017-05-08 (×2): 50 ug via INTRAVENOUS

## 2017-05-08 MED ORDER — OXYCODONE-ACETAMINOPHEN 5-325 MG PO TABS
1.0000 | ORAL_TABLET | ORAL | Status: DC | PRN
  Administered 2017-05-09 (×3): 1 via ORAL
  Filled 2017-05-08 (×3): qty 1

## 2017-05-08 MED ORDER — MIDAZOLAM HCL 2 MG/2ML IJ SOLN
INTRAMUSCULAR | Status: DC | PRN
  Administered 2017-05-08: 2 mg via INTRAVENOUS

## 2017-05-08 MED ORDER — ROCURONIUM BROMIDE 100 MG/10ML IV SOLN
INTRAVENOUS | Status: AC
  Filled 2017-05-08: qty 1

## 2017-05-08 MED ORDER — ACETAMINOPHEN 500 MG PO TABS: 1000.0000 mg | ORAL_TABLET | Freq: Once | ORAL | Status: DC

## 2017-05-08 MED ORDER — PHENYLEPHRINE 40 MCG/ML (10ML) SYRINGE FOR IV PUSH (FOR BLOOD PRESSURE SUPPORT)
PREFILLED_SYRINGE | INTRAVENOUS | Status: AC
  Filled 2017-05-08: qty 10

## 2017-05-08 MED ORDER — LIDOCAINE HCL (CARDIAC) 20 MG/ML IV SOLN
INTRAVENOUS | Status: AC
  Filled 2017-05-08: qty 5

## 2017-05-08 MED ORDER — DIPHENHYDRAMINE HCL 12.5 MG/5ML PO ELIX: 12.5000 mg | ORAL_SOLUTION | Freq: Four times a day (QID) | ORAL | Status: DC | PRN

## 2017-05-08 MED ORDER — HYDROMORPHONE HCL 1 MG/ML IJ SOLN
INTRAMUSCULAR | Status: DC | PRN
  Administered 2017-05-08: 1 mg via INTRAVENOUS

## 2017-05-08 MED ORDER — METHYLENE BLUE 0.5 % INJ SOLN
INTRAVENOUS | Status: AC
  Filled 2017-05-08: qty 10

## 2017-05-08 MED ORDER — IBUPROFEN 600 MG PO TABS
600.0000 mg | ORAL_TABLET | Freq: Four times a day (QID) | ORAL | Status: DC | PRN
  Administered 2017-05-09 – 2017-05-10 (×3): 600 mg via ORAL
  Filled 2017-05-08 (×3): qty 1

## 2017-05-08 MED ORDER — DEXTROSE 5 % IV SOLN
INTRAVENOUS | Status: AC
  Administered 2017-05-08: 115 mL via INTRAVENOUS
  Filled 2017-05-08: qty 9

## 2017-05-08 MED ORDER — SCOPOLAMINE 1 MG/3DAYS TD PT72
1.0000 | MEDICATED_PATCH | Freq: Once | TRANSDERMAL | Status: DC
  Administered 2017-05-08: 1.5 mg via TRANSDERMAL

## 2017-05-08 MED ORDER — DEXAMETHASONE SODIUM PHOSPHATE 4 MG/ML IJ SOLN
INTRAMUSCULAR | Status: DC | PRN
  Administered 2017-05-08: 4 mg via INTRAVENOUS

## 2017-05-08 MED ORDER — CELECOXIB 200 MG PO CAPS: 200.0000 mg | ORAL_CAPSULE | Freq: Once | ORAL | Status: DC

## 2017-05-08 MED ORDER — LACTATED RINGERS IV SOLN
INTRAVENOUS | Status: DC
  Administered 2017-05-08: 125 mL/h via INTRAVENOUS
  Administered 2017-05-08 (×3): via INTRAVENOUS

## 2017-05-08 MED ORDER — LACTATED RINGERS IV SOLN
INTRAVENOUS | Status: DC
  Administered 2017-05-08: 20:00:00 via INTRAVENOUS

## 2017-05-08 MED ORDER — FENTANYL CITRATE (PF) 250 MCG/5ML IJ SOLN
INTRAMUSCULAR | Status: AC
  Filled 2017-05-08: qty 5

## 2017-05-08 MED ORDER — PHENYLEPHRINE HCL 10 MG/ML IJ SOLN
INTRAMUSCULAR | Status: DC | PRN
  Administered 2017-05-08: 40 ug via INTRAVENOUS
  Administered 2017-05-08: 80 ug via INTRAVENOUS

## 2017-05-08 MED ORDER — FENTANYL CITRATE (PF) 100 MCG/2ML IJ SOLN
INTRAMUSCULAR | Status: AC
  Filled 2017-05-08: qty 2

## 2017-05-08 MED ORDER — SUGAMMADEX SODIUM 200 MG/2ML IV SOLN
INTRAVENOUS | Status: DC | PRN
  Administered 2017-05-08: 200 mg via INTRAVENOUS

## 2017-05-08 MED ORDER — PROPOFOL 10 MG/ML IV BOLUS
INTRAVENOUS | Status: AC
  Filled 2017-05-08: qty 20

## 2017-05-08 MED ORDER — PROMETHAZINE HCL 25 MG/ML IJ SOLN: 6.2500 mg | INTRAMUSCULAR | Status: DC | PRN

## 2017-05-08 MED ORDER — BUPIVACAINE HCL (PF) 0.25 % IJ SOLN
INTRAMUSCULAR | Status: AC
  Filled 2017-05-08: qty 30

## 2017-05-08 MED ORDER — ONDANSETRON HCL 4 MG/2ML IJ SOLN
INTRAMUSCULAR | Status: DC | PRN
  Administered 2017-05-08: 4 mg via INTRAVENOUS

## 2017-05-08 MED ORDER — MIDAZOLAM HCL 2 MG/2ML IJ SOLN
INTRAMUSCULAR | Status: AC
  Filled 2017-05-08: qty 2

## 2017-05-08 MED ORDER — SODIUM CHLORIDE 0.9% FLUSH: 9.0000 mL | INTRAVENOUS | Status: DC | PRN

## 2017-05-08 MED ORDER — ROCURONIUM BROMIDE 100 MG/10ML IV SOLN
INTRAVENOUS | Status: DC | PRN
  Administered 2017-05-08: 10 mg via INTRAVENOUS
  Administered 2017-05-08: 60 mg via INTRAVENOUS

## 2017-05-08 MED ORDER — MENTHOL 3 MG MT LOZG: 1.0000 | LOZENGE | OROMUCOSAL | Status: DC | PRN

## 2017-05-08 MED ORDER — HYDROMORPHONE HCL 1 MG/ML IJ SOLN
INTRAMUSCULAR | Status: AC
  Filled 2017-05-08: qty 1

## 2017-05-08 MED ORDER — DEXAMETHASONE SODIUM PHOSPHATE 4 MG/ML IJ SOLN
INTRAMUSCULAR | Status: AC
  Filled 2017-05-08: qty 1

## 2017-05-08 SURGICAL SUPPLY — 43 items
BARRIER ADHS 3X4 INTERCEED (GAUZE/BANDAGES/DRESSINGS) IMPLANT
BRR ADH 4X3 ABS CNTRL BYND (GAUZE/BANDAGES/DRESSINGS)
CANISTER SUCT 3000ML PPV (MISCELLANEOUS) ×4 IMPLANT
CLOTH BEACON ORANGE TIMEOUT ST (SAFETY) ×4 IMPLANT
CONT PATH 16OZ SNAP LID 3702 (MISCELLANEOUS) ×4 IMPLANT
DECANTER SPIKE VIAL GLASS SM (MISCELLANEOUS) IMPLANT
DISSECTOR SPONGE CHERRY (GAUZE/BANDAGES/DRESSINGS) ×4 IMPLANT
DRAPE WARM FLUID 44X44 (DRAPE) ×4 IMPLANT
DRSG OPSITE POSTOP 4X10 (GAUZE/BANDAGES/DRESSINGS) ×4 IMPLANT
DURAPREP 26ML APPLICATOR (WOUND CARE) ×4 IMPLANT
GAUZE SPONGE 4X4 16PLY XRAY LF (GAUZE/BANDAGES/DRESSINGS) ×4 IMPLANT
GLOVE BIO SURGEON STRL SZ7.5 (GLOVE) ×4 IMPLANT
GLOVE BIOGEL PI IND STRL 7.0 (GLOVE) ×4 IMPLANT
GLOVE BIOGEL PI IND STRL 7.5 (GLOVE) ×4 IMPLANT
GLOVE BIOGEL PI INDICATOR 7.0 (GLOVE) ×4
GLOVE BIOGEL PI INDICATOR 7.5 (GLOVE) ×4
GOWN STRL REUS W/TWL LRG LVL3 (GOWN DISPOSABLE) ×12 IMPLANT
GUIDEWIRE ANG ZIPWIRE 038X150 (WIRE) ×4 IMPLANT
HEMOSTAT SURGICEL 4X8 (HEMOSTASIS) ×4 IMPLANT
NEEDLE HYPO 22GX1.5 SAFETY (NEEDLE) ×4 IMPLANT
NS IRRIG 1000ML POUR BTL (IV SOLUTION) ×4 IMPLANT
PACK ABDOMINAL GYN (CUSTOM PROCEDURE TRAY) ×4 IMPLANT
PAD OB MATERNITY 4.3X12.25 (PERSONAL CARE ITEMS) ×4 IMPLANT
PENCIL SMOKE EVAC W/HOLSTER (ELECTROSURGICAL) ×4 IMPLANT
PROTECTOR NERVE ULNAR (MISCELLANEOUS) ×8 IMPLANT
SET CYSTO W/LG BORE CLAMP LF (SET/KITS/TRAYS/PACK) ×4 IMPLANT
SHEET LAVH (DRAPES) ×4 IMPLANT
SPONGE LAP 18X18 X RAY DECT (DISPOSABLE) ×4 IMPLANT
SUT CHROMIC 2 0 CT 1 (SUTURE) ×4 IMPLANT
SUT CHROMIC 2 0 SH (SUTURE) IMPLANT
SUT MNCRL AB 3-0 PS2 27 (SUTURE) ×4 IMPLANT
SUT MON AB 3-0 SH 27 (SUTURE)
SUT MON AB 3-0 SH27 (SUTURE) IMPLANT
SUT PDS AB 1 CTX 36 (SUTURE) IMPLANT
SUT PLAIN 2 0 XLH (SUTURE) ×4 IMPLANT
SUT VIC AB 0 CT1 18XCR BRD8 (SUTURE) ×6 IMPLANT
SUT VIC AB 0 CT1 27 (SUTURE) ×8
SUT VIC AB 0 CT1 27XBRD ANBCTR (SUTURE) ×4 IMPLANT
SUT VIC AB 0 CT1 8-18 (SUTURE) ×12
SUT VICRYL 0 TIES 12 18 (SUTURE) ×4 IMPLANT
SYR CONTROL 10ML LL (SYRINGE) ×4 IMPLANT
TOWEL OR 17X24 6PK STRL BLUE (TOWEL DISPOSABLE) ×8 IMPLANT
TRAY FOLEY CATH SILVER 14FR (SET/KITS/TRAYS/PACK) ×4 IMPLANT

## 2017-05-08 NOTE — Op Note (Addendum)
Preop Diagnosis: Symptomatic Uterine Fibroids   Postop Diagnosis: Symptomatic Uterine Fibroids   Procedure: 1.Total Abdominal Hysterectomy 2.Bilateral Salpingectomy 3.Cystoscopy   Anesthesia: General   Anesthesiologist: Catalina Gravel, MD   Attending: Everett Graff, MD   Assistant: Earnstine Regal, PA-C  Findings: Large fibroid uterus 1345.7g  Pathology: Uterus, cervix, bilateral tubes and fibroids  Fluids: 3500 cc  UOP: 500 cc  EBL: 951 cc  Complications: None  Procedure: The patient received intravenous antibiotics and had sequential compression devices applied to her lower extremities while in the preoperative area.   She was taken to the operating room after the risks, benefits and alternatives reviewed and placed under general anesthesia without difficulty.  A Time Out was performed.  The abdomen and perineum were prepped and draped in a sterile manner, and she was placed in a dorsal lithotomy position.  A Foley catheter was inserted into the bladder and attached to constant drainage. After an adequate timeout was performed, a Pfannensteil skin incision was made. This incision was taken down to the fascia using electrocautery with care given to maintain good hemostasis. The fascia was incised in the midline and the fascial incision was then extended bilaterally using electrocautery without difficulty. The fascia was then dissected off the underlying rectus muscles using blunt and sharp dissection. The rectus muscles were split bluntly in the midline and the peritoneum entered bluntly without complication. This peritoneal incision was then extended superiorly and inferiorly with care given to prevent bowel or bladder injury. Attention was then turned to the pelvis. A retractor was placed into the incision, and the bowel was packed away with moist laparotomy sponges. The uterus at this point was noted to be mobilized and was delivered up out of the abdomen.  The bowel was  packed away with moist laparotomy sponges and a balfour retractor placed. The round ligaments on each side were clamped, suture ligated with 0 Vicryl, and transected with electrocautery allowing entry into the broad ligament. Of note, all sutures used in this procedure are 0 Vicryl unless otherwise noted.   A hole was created in the clear portion of the posterior broad ligament and the utero-ovarian ligament was clamped on the patient's left side then cut, and doubly suture ligated with good hemostasis.  This procedure was repeated in an identical fashion on the opposite side.  A bladder flap was then created.  The bladder was then bluntly dissected off the lower uterine segment and cervix with good hemostasis noted. The uterine arteries were then skeletonized bilaterally and then clamped, cut, and doubly suture ligated with care given to prevent ureteral injury.  The uterus was then amputated across the lower uterine segment.  The uterosacral ligaments were then clamped, cut, and ligated bilaterally.  Finally, the cardinal ligaments were clamped, cut, and suture ligated bilaterally.  Acutely curved clamps were placed across the vagina just under the cervix, and the specimen was amputated and sent to pathology. The vaginal cuff angles were closed with Heaney stiches with care given to incorporate the uterosacral-cardinal ligament pedicles on both sides. The middle of the vaginal cuff was closed with a series of interrupted figure-of-eight sutures with care given to incorporate the anterior pubocervical fascia and the posterior rectovaginal fascia.   The pelvis was irrigated and hemostasis was reconfirmed at all pedicles and along the pelvic sidewall.   Surgicel was applied to the cuff and over the ovaries bilaterally.  All laparotomy sponges and instruments were removed from the abdomen. The peritoneum was closed with  a running stitch of 2-0 chromic, and the fascia was closed in a running fashion using 0 vicryl. The  subcutaneous layer was reapproximated with 2-0 plain gut. The skin was closed with a 3-0 monocryl subcuticular stitch. Steristrips and benzoin were applied.  Cystoscopy was performed after administering methylene and peristalsis was noted from both ureters after filling the bladder with D5 irrigation through the cytoscope.  Vaginal cuff inspected and noted to have some oozing at the left aspect of cuff.  A stitch placed along the length of the cuff running and interlocking with good hemostasis noted.  Sponge, lap, needle, and instrument counts were correct times two. The patient was taken to the recovery area awake, extubated and in stable condition.

## 2017-05-08 NOTE — Anesthesia Preprocedure Evaluation (Addendum)
Anesthesia Evaluation  Patient identified by MRN, date of birth, ID band Patient awake    Reviewed: Allergy & Precautions, NPO status , Patient's Chart, lab work & pertinent test results  Airway Mallampati: III  TM Distance: >3 FB Neck ROM: Full    Dental  (+) Teeth Intact, Dental Advisory Given   Pulmonary neg pulmonary ROS,    Pulmonary exam normal breath sounds clear to auscultation       Cardiovascular hypertension, Normal cardiovascular exam Rhythm:Regular Rate:Normal     Neuro/Psych  Headaches,    GI/Hepatic Neg liver ROS, GERD  Medicated,  Endo/Other  negative endocrine ROS  Renal/GU negative Renal ROS     Musculoskeletal negative musculoskeletal ROS (+)   Abdominal   Peds  Hematology  (+) Blood dyscrasia, anemia , Plt 337k   Anesthesia Other Findings Day of surgery medications reviewed with the patient.  Reproductive/Obstetrics negative OB ROS                           Anesthesia Physical Anesthesia Plan  ASA: II  Anesthesia Plan: General   Post-op Pain Management:    Induction: Intravenous  PONV Risk Score and Plan: 3 and Ondansetron, Dexamethasone, Midazolam and Scopolamine patch - Pre-op  Airway Management Planned: Oral ETT  Additional Equipment:   Intra-op Plan:   Post-operative Plan: Extubation in OR  Informed Consent: I have reviewed the patients History and Physical, chart, labs and discussed the procedure including the risks, benefits and alternatives for the proposed anesthesia with the patient or authorized representative who has indicated his/her understanding and acceptance.   Dental advisory given  Plan Discussed with: CRNA  Anesthesia Plan Comments: (2nd IV after induction.)      Anesthesia Quick Evaluation

## 2017-05-08 NOTE — Transfer of Care (Signed)
Immediate Anesthesia Transfer of Care Note  Patient: [name]  Procedure(s) Performed: Procedure(s): HYSTERECTOMY ABDOMINAL WITH BILATERAL SALPINGECTOMY (N/A) CYSTOSCOPY (N/A)  Patient Location: PACU  Anesthesia Type:General  Level of Consciousness: sedated  Airway & Oxygen Therapy: Patient Spontanous Breathing and Patient connected to nasal cannula oxygen  Post-op Assessment: Report given to RN and Post -op Vital signs reviewed and stable  Post vital signs: stable  Last Vitals:  Vitals:   05/08/17 1212 05/08/17 1733  BP: (!) 152/102   Pulse: 97 (!) (P) 108  Resp: 16 (P) 12  Temp: 36.8 C (P) 37.1 C  SpO2: 100% (P) 99%    Last Pain:  Vitals:   05/08/17 1212  TempSrc: Oral      Patients Stated Pain Goal: 4 (27/12/92 9090)  Complications: No apparent anesthesia complications

## 2017-05-08 NOTE — Anesthesia Procedure Notes (Signed)
Procedure Name: Intubation Date/Time: 05/08/2017 1:45 PM Performed by: Raenette Rover Pre-anesthesia Checklist: Patient identified, Emergency Drugs available, Suction available and Patient being monitored Patient Re-evaluated:Patient Re-evaluated prior to induction Oxygen Delivery Method: Circle system utilized Preoxygenation: Pre-oxygenation with 100% oxygen Induction Type: IV induction Ventilation: Mask ventilation without difficulty and Oral airway inserted - appropriate to patient size Laryngoscope Size: Mac and 3 Grade View: Grade I Tube type: Oral Tube size: 7.0 mm Number of attempts: 1 Airway Equipment and Method: Stylet Placement Confirmation: ETT inserted through vocal cords under direct vision,  positive ETCO2,  CO2 detector and breath sounds checked- equal and bilateral Secured at: 21 cm Tube secured with: Tape Dental Injury: Teeth and Oropharynx as per pre-operative assessment

## 2017-05-08 NOTE — Progress Notes (Signed)
Day of Surgery Procedure(s) (LRB): HYSTERECTOMY ABDOMINAL WITH BILATERAL SALPINGECTOMY (N/A) CYSTOSCOPY (N/A)  Subjective: Patient reports no complaints.    Objective: I have reviewed patient's vital signs and intake and output.  General: alert and no distress Resp: clear to auscultation bilaterally Cardio: regular rate and rhythm GI: soft, app tender, ND, no BS, dressing c/d/i Extremities: no calf tenderness Vaginal Bleeding: none  Assessment: s/p Procedure(s): HYSTERECTOMY ABDOMINAL WITH BILATERAL SALPINGECTOMY (N/A) CYSTOSCOPY (N/A): stable  Plan: CBC at 9:30p and in the morning  Encourage IS SCDs are on for DVT prophylaxis Slow GI fxn but no nausea Routine post op care  LOS: 0 days    [name] Y 05/08/2017, 9:05 PM

## 2017-05-08 NOTE — Interval H&P Note (Signed)
History and Physical Interval Note:  05/08/2017 1:26 PM  [name]  has presented today for surgery, with the diagnosis of Uterine Fibroids - 3.5 Hours  The various methods of treatment have been discussed with the patient and family. After consideration of risks, benefits and other options for treatment, the patient has consented to  Procedure(s): HYSTERECTOMY ABDOMINAL WITH BILATERAL SALPINGECTOMY (N/A) POSSIBLE CYSTOSCOPY (N/A) as a surgical intervention .  The patient's history has been reviewed, patient examined, no change in status, stable for surgery.  I have reviewed the patient's chart and labs.  Questions were answered to the patient's satisfaction.     Delice Lesch

## 2017-05-09 LAB — CBC
HCT: 26.6 % — ABNORMAL LOW (ref 36.0–46.0)
Hemoglobin: 8.4 g/dL — ABNORMAL LOW (ref 12.0–15.0)
MCH: 24.5 pg — ABNORMAL LOW (ref 26.0–34.0)
MCHC: 31.6 g/dL (ref 30.0–36.0)
MCV: 77.6 fL — ABNORMAL LOW (ref 78.0–100.0)
Platelets: 337 10*3/uL (ref 150–400)
RBC: 3.43 MIL/uL — ABNORMAL LOW (ref 3.87–5.11)
RDW: 15.3 % (ref 11.5–15.5)
WBC: 12.1 10*3/uL — ABNORMAL HIGH (ref 4.0–10.5)

## 2017-05-09 NOTE — Progress Notes (Signed)
[name] is a10 y.o.  [id number]  Post Op Date # 1:  TAH/BS/Cystoscopy  Subjective: Patient is Doing well postoperatively. Patient has Pain is controlled with current analgesics. Medications being used: narcotic analgesics including Percocet 5/325. Ambulating in the halls without dizziness, tolerating crackers and water,  has voided without difficulty and denies any nausea.   Objective: Vital signs in last 24 hours: Temp:  [97.6 F (36.4 C)-98.8 F (37.1 C)] 97.6 F (36.4 C) (08/10 0400) Pulse Rate:  [91-108] 91 (08/10 0400) Resp:  [9-22] 14 (08/10 0415) BP: (138-156)/(86-116) 138/86 (08/10 0400) SpO2:  [96 %-100 %] 100 % (08/10 0415)  Intake/Output from previous day: 08/09 0701 - 08/10 0700 In: 4902 [P.O.:290; I.V.:4612] Out: 7867 [Urine:4225] Intake/Output this shift: No intake/output data recorded.  Recent Labs Lab 05/08/17 1758 05/08/17 2120 05/09/17 0529  WBC 18.4* 14.6* 12.1*  HGB 9.0* 8.8* 8.4*  HCT 28.7* 27.7* 26.6*  PLT 367 333 337    No results for input(s): NA, K, CL, CO2, BUN, CREATININE, CALCIUM, PROT, BILITOT, ALKPHOS, ALT, AST, GLUCOSE in the last 168 hours.  Invalid input(s): LABALBU  EXAM: General: alert, cooperative and no distress Resp: clear to auscultation bilaterally Cardio: regular rate and rhythm, S1, S2 normal, no murmur, click, rub or gallop GI: decreased bowel sounds,  soft, incision is intact/clean/dry Extremities: Homans sign is negative, no sign of DVT and no calf tenderness.   Assessment: s/p Procedure(s): HYSTERECTOMY ABDOMINAL WITH BILATERAL SALPINGECTOMY CYSTOSCOPY: stable, progressing well and anemia  Plan: Advance diet Encourage ambulation Routine care.  LOS: 1 day    POWELL,ELMIRA, PA-C 05/09/2017 7:46 AM  3:30 NABS, tolerating po without n/v, ambulating without difficulty and voiding spontaneously.  Dressing c/d/i with minimal brown discharge.  Anticipate d/c tomorrow.

## 2017-05-09 NOTE — Discharge Instructions (Signed)
Call [facility] @ [phone] if:  You have a temperature greater than or equal to 100.4 degrees Farenheit orally You have pain that is not made better by the pain medication given and taken as directed You have excessive bleeding or problems urinating  Take Colace (Docusate Sodium/Stool Softener) 100 mg 2-3 times daily while taking narcotic pain medicine to avoid constipation or until bowel movements are regular. Take Ibuprofen 600 mg with food every 6 hours for 5 days then as needed for pain Take over the counter iron of your choice,  twice a day for the next 3 months  You may drive after 2 weeks You may walk up steps  You may shower  You may resume a regular diet  Keep incisions clean and dry;  remove honeycomb dressing 8.17/18 Do not lift over 15 pounds for 6 weeks Avoid anything in vagina for 6 weeks (or until after your post-operative visit)

## 2017-05-09 NOTE — Anesthesia Postprocedure Evaluation (Signed)
Anesthesia Post Note  Patient: [name]  Procedure(s) Performed: Procedure(s) (LRB): HYSTERECTOMY ABDOMINAL WITH BILATERAL SALPINGECTOMY (N/A) CYSTOSCOPY (N/A)     Patient location during evaluation: PACU Anesthesia Type: General Level of consciousness: awake and alert Pain management: pain level controlled Vital Signs Assessment: post-procedure vital signs reviewed and stable Respiratory status: spontaneous breathing, nonlabored ventilation, respiratory function stable and patient connected to nasal cannula oxygen Cardiovascular status: blood pressure returned to baseline and stable Postop Assessment: no signs of nausea or vomiting Anesthetic complications: no    Last Vitals:  Vitals:   05/09/17 1200 05/09/17 1610  BP: (!) 156/91 (!) 141/79  Pulse: 92 89  Resp: 16 16  Temp: 36.8 C 37 C  SpO2: 100% 100%    Last Pain:  Vitals:   05/09/17 1610  TempSrc: Oral  PainSc:                  Catalina Gravel

## 2017-05-10 ENCOUNTER — Encounter (HOSPITAL_COMMUNITY): Payer: Self-pay | Admitting: Obstetrics and Gynecology

## 2017-05-10 MED ORDER — OXYCODONE-ACETAMINOPHEN 5-325 MG PO TABS: 1.0000 | ORAL_TABLET | Freq: Four times a day (QID) | ORAL | 0 refills | Status: DC | PRN

## 2017-05-10 MED ORDER — IBUPROFEN 600 MG PO TABS: 600.0000 mg | ORAL_TABLET | Freq: Four times a day (QID) | ORAL | 0 refills | Status: DC | PRN

## 2017-05-10 NOTE — Discharge Summary (Signed)
Physician Discharge Summary  Patient ID: DAJSHA MASSARO MRN: [id number] DOB/AGE: 01-May-1976 41 y.o.  Admit date: 05/08/2017 Discharge date: 05/10/2017  Admission Diagnoses:  Discharge Diagnoses:  Active Problems:   Fibroid, uterine   Discharged Condition: good  Hospital Course: Pt admitted for scheduled procedure.  Pt underwent TAH/BS and cystoscopy and had an uneventful course.  The pt's BP was elevated pod 1 but was improving by POD 2.  Pt doing well on POD 2 with +flatus, NABS, tolerating regular diet, ambulating without light headedness or dizziness and voiding spontaneously without difficulty.  Discharge instructions reviewed.  Consults: None  Significant Diagnostic Studies: check labs Hgb 8.4  Treatments: IV hydration, analgesia: ibuprofen and percocet and surgery: TAH/BS/Cystoscopy  Discharge Exam: Blood pressure 128/79, pulse 88, temperature 98.6 F (37 C), temperature source Oral, resp. rate 18, height 5\' 3"  (1.6 m), weight 98.9 kg (218 lb), last menstrual period 05/03/2017, SpO2 99 %. General appearance: alert and no distress Resp: clear to auscultation bilaterally Cardio: regular rate and rhythm GI: soft, app tender, dressing intact, NABS, ND, no rebound or guarding Extremities: no calf tenderness Incision/Wound: dressing c/d/i  Disposition: 01-Home or Self Care   Allergies as of 05/10/2017      Reactions   Penicillins Hives   Has patient had a PCN reaction causing immediate rash, facial/tongue/throat swelling, SOB or lightheadedness with hypotension: Yes Has patient had a PCN reaction causing severe rash involving mucus membranes or skin necrosis: No Has patient had a PCN reaction that required hospitalization: No Has patient had a PCN reaction occurring within the last 10 years: No If all of the above answers are "NO", then may proceed with Cephalosporin use.      Medication List    TAKE these medications   aspirin-acetaminophen-caffeine 250-250-65 MG  tablet Commonly known as:  EXCEDRIN MIGRAINE Take 1 tablet by mouth every 6 (six) hours as needed for pain (migraines).  Do not take while taking percocet.    ibuprofen 600 MG tablet Commonly known as:  ADVIL,MOTRIN Take 1 tablet (600 mg total) by mouth every 6 (six) hours as needed (mild pain).   oxyCODONE-acetaminophen 5-325 MG tablet Commonly known as:  PERCOCET/ROXICET Take 1-2 tablets by mouth every 6 (six) hours as needed for severe pain (moderate to severe pain (when tolerating fluids)).   PEPCID AC 10 MG tablet Generic drug:  famotidine Take 10 mg by mouth as needed for heartburn.      Follow-up Information    Everett Graff, MD On 06/17/2017.   Specialty:  Obstetrics and Gynecology Why:  appointment time is 10:15 a.m. Contact information: [address] [place] [address] [phone]           Signed: Delice Lesch 05/10/2017, 11:57 AM

## 2017-05-10 NOTE — Progress Notes (Signed)
Pt discharged home with daughter.  Condition stable.  Pt ambulated to car with Santiago Bur, NT.  No equipment for home ordered at discharge.

## 2017-05-12 LAB — TYPE AND SCREEN
ABO/RH(D): O POS
Antibody Screen: NEGATIVE
UNIT DIVISION: 0
Unit division: 0

## 2017-05-12 LAB — BPAM RBC
Blood Product Expiration Date: 201809052359
Blood Product Expiration Date: 201809052359
Unit Type and Rh: 5100
Unit Type and Rh: 5100

## 2017-10-05 ENCOUNTER — Encounter (HOSPITAL_COMMUNITY): Payer: Self-pay | Admitting: *Deleted

## 2017-10-05 ENCOUNTER — Other Ambulatory Visit: Payer: Self-pay

## 2017-10-05 ENCOUNTER — Emergency Department (HOSPITAL_COMMUNITY)
Admission: EM | Admit: 2017-10-05 | Discharge: 2017-10-05 | Disposition: A | Discharge status: Home / Self Care | Payer: 59 | Attending: Emergency Medicine | Admitting: Emergency Medicine

## 2017-10-05 DIAGNOSIS — I10 Essential (primary) hypertension: Secondary | ICD-10-CM | POA: Diagnosis present

## 2017-10-05 DIAGNOSIS — Z5321 Procedure and treatment not carried out due to patient leaving prior to being seen by health care provider: Secondary | ICD-10-CM | POA: Diagnosis not present

## 2017-10-05 NOTE — ED Triage Notes (Signed)
Pt states she had a bloody nose and checked her bp.  At home her bp was 202/120.  Denies hx of hypertension, though states one high reading, once.  States some lightheadedness earlier today but denies blurred vision or nausea.

## 2017-10-05 NOTE — ED Notes (Signed)
Pt called for A10 in lobby but no answer

## 2017-10-05 NOTE — ED Notes (Signed)
Pt called three times, no answer.  

## 2017-12-02 ENCOUNTER — Other Ambulatory Visit: Payer: Self-pay

## 2017-12-02 ENCOUNTER — Emergency Department (HOSPITAL_COMMUNITY)
Admission: EM | Admit: 2017-12-02 | Discharge: 2017-12-02 | Disposition: A | Discharge status: Home / Self Care | Payer: 59 | Attending: Emergency Medicine | Admitting: Emergency Medicine

## 2017-12-02 ENCOUNTER — Encounter (HOSPITAL_COMMUNITY): Payer: Self-pay | Admitting: Emergency Medicine

## 2017-12-02 DIAGNOSIS — I160 Hypertensive urgency: Secondary | ICD-10-CM | POA: Diagnosis not present

## 2017-12-02 DIAGNOSIS — R51 Headache: Secondary | ICD-10-CM | POA: Diagnosis present

## 2017-12-02 DIAGNOSIS — R519 Headache, unspecified: Secondary | ICD-10-CM

## 2017-12-02 HISTORY — DX: Essential (primary) hypertension: I10

## 2017-12-02 NOTE — ED Triage Notes (Addendum)
Pt reports having headache that started tonight and blood pressure was elevated. Pt reports that she stopped taking Losartan 4 days ago due to the recall notifications. Tylenol 650mg  taken at 3:30am

## 2017-12-02 NOTE — ED Provider Notes (Signed)
[facility] DEPT Provider Note   CSN: [id number] Arrival date & time: 12/02/17  0553     History   Chief Complaint Chief Complaint  Patient presents with  . Hypertension  . Headache    HPI [name] is a 42 y.o. female.  HPI  42 year old female with a history of hypertension presents with headache and hypertension.  She states she woke up at 2 AM with a headache.  Started on her occiput and now is mostly left-sided.  She has a history of both chronic headaches like this as well as chronic history of migraines.  The migraines are typically worse.  This headache is about a 6 out of 10 and not worse than her typical.  She took some Tylenol.  She checked her blood pressure at home and it was 172/112.  She usually checks her blood pressure daily and is usually around 150-160/90.  She was started on Cozaar last month by her doctor.  However she states that she stopped it a few days ago because she was reading about multiple recalls of this medication.  She denies any blurry vision, dizziness, vomiting, or weakness/numbness in her extremities.  No chest pain at this time.  Past Medical History:  Diagnosis Date  . Anemia   . Dyspnea    SOME SOB WITH EXERTION  . GERD (gastroesophageal reflux disease)    OCCASIONAL - DIET AND PEPCID PRN  . Hypertension   . Migraines   . SVD (spontaneous vaginal delivery)    X 2    Patient Active Problem List   Diagnosis Date Noted  . Fibroid, uterine 05/08/2017  . Menorrhagia 02/26/2013  . OBESITY 09/02/2008  . ESSENTIAL HYPERTENSION 09/02/2008  . ANEMIA, IRON DEFICIENCY NOS 10/13/2006  . MIGRAINE HEADACHE 10/13/2006  . ALLERGIC RHINITIS 10/13/2006    Past Surgical History:  Procedure Laterality Date  . ABDOMINAL HYSTERECTOMY    . BREAST LUMPECTOMY WITH RADIOACTIVE SEED LOCALIZATION Right 09/29/2014   Procedure: RIGHT BREAST LUMPECTOMY WITH RADIOACTIVE SEED LOCALIZATION;  Surgeon: Stark Klein, MD;   Location: [facility];  Service: General;  Laterality: Right;  . BREAST SURGERY Right 12/2016   BIOPSY - BENIGN  . CYSTOSCOPY N/A 05/08/2017   Procedure: CYSTOSCOPY;  Surgeon: Everett Graff, MD;  Location: [facility];  Service: Gynecology;  Laterality: N/A;  . HYSTERECTOMY ABDOMINAL WITH SALPINGECTOMY N/A 05/08/2017   Procedure: HYSTERECTOMY ABDOMINAL WITH BILATERAL SALPINGECTOMY;  Surgeon: Everett Graff, MD;  Location: [facility];  Service: Gynecology;  Laterality: N/A;    OB History    Gravida Para Term Preterm AB Living   2 2 2          SAB TAB Ectopic Multiple Live Births                   Home Medications    Prior to Admission medications   Medication Sig Start Date End Date Taking? Authorizing Provider  aspirin-acetaminophen-caffeine (EXCEDRIN MIGRAINE) [phone] MG per tablet Take 1 tablet by mouth every 6 (six) hours as needed for pain (migraines).    [provider]  famotidine (PEPCID AC) 10 MG tablet Take 10 mg by mouth as needed for heartburn.     [provider]  ibuprofen (ADVIL,MOTRIN) 600 MG tablet Take 1 tablet (600 mg total) by mouth every 6 (six) hours as needed (mild pain). 05/10/17   Everett Graff, MD  oxyCODONE-acetaminophen (PERCOCET/ROXICET) 5-325 MG tablet Take 1-2 tablets by mouth every 6 (six) hours as needed  for severe pain (moderate to severe pain (when tolerating fluids)). 05/10/17   Everett Graff, MD    Family History Family History  Problem Relation Age of Onset  . Hyperlipidemia Mother   . Hypertension Father     Social History Social History   Tobacco Use  . Smoking status: Never Smoker  . Smokeless tobacco: Never Used  Substance Use Topics  . Alcohol use: No  . Drug use: No     Allergies   Penicillins   Review of Systems Review of Systems  Eyes: Negative for photophobia and visual disturbance.  Cardiovascular: Negative for chest pain.  Gastrointestinal: Negative for nausea and vomiting.  Neurological:  Positive for headaches. Negative for weakness and numbness.  All other systems reviewed and are negative.    Physical Exam Updated Vital Signs BP (!) 171/98 (BP Location: Left Arm)   Pulse 99   Temp 98.4 F (36.9 C) (Oral)   Resp 18   Ht 5\' 4"  (1.626 m)   Wt 92.5 kg (204 lb)   LMP 05/03/2017 (Exact Date)   SpO2 97%   BMI 35.02 kg/m   Physical Exam  Constitutional: She is oriented to person, place, and time. She appears well-developed and well-nourished.  Non-toxic appearance. She does not appear ill. No distress.  HENT:  Head: Normocephalic and atraumatic.  Right Ear: External ear normal.  Left Ear: External ear normal.  Nose: Nose normal.  Eyes: EOM are normal. Pupils are equal, round, and reactive to light. Right eye exhibits no discharge. Left eye exhibits no discharge.  Neck: Normal range of motion. Neck supple.  Cardiovascular: Normal rate, regular rhythm and normal heart sounds.  Pulmonary/Chest: Effort normal and breath sounds normal.  Abdominal: Soft. There is no tenderness.  Neurological: She is alert and oriented to person, place, and time.  CN 3-12 grossly intact. 5/5 strength in all 4 extremities. Grossly normal sensation. Normal finger to nose.   Skin: Skin is warm and dry.  Nursing note and vitals reviewed.    ED Treatments / Results  Labs (all labs ordered are listed, but only abnormal results are displayed) Labs Reviewed - No data to display  EKG  EKG Interpretation None       Radiology No results found.  Procedures Procedures (including critical care time)  Medications Ordered in ED Medications - No data to display   Initial Impression / Assessment and Plan / ED Course  I have reviewed the triage vital signs and the nursing notes.  Pertinent labs & imaging results that were available during my care of the patient were reviewed by me and considered in my medical decision making (see chart for details).     Patient is resting  comfortably with a benign exam.  Her blood pressure is elevated today but not that far off from her typical baseline.  My suspicion of an acute CNS emergency such as subarachnoid hemorrhage, epidural/subdural hematoma, stroke, venous thrombosis, infection/meningitis, etc. is quite low.  These headaches are typical for her.  I do not think lab work would be beneficial.  She is not having any active chest pain or shortness of breath.  We discussed options such as starting a new medicine from the ED versus calling her doctor when their clinic opens later this morning and starting a new medicine from her PCP.  She prefers this latter option.  She appears stable for discharge and we discussed return precautions.  Final Clinical Impressions(s) / ED Diagnoses   Final diagnoses:  Hypertensive urgency  Left-sided headache    ED Discharge Orders    None       Sherwood Gambler, MD 12/02/17 [phone]

## 2019-02-16 ENCOUNTER — Emergency Department (HOSPITAL_COMMUNITY)
Admission: EM | Admit: 2019-02-16 | Discharge: 2019-02-16 | Disposition: A | Discharge status: Home / Self Care | Payer: 59 | Attending: Emergency Medicine | Admitting: Emergency Medicine

## 2019-02-16 ENCOUNTER — Other Ambulatory Visit: Payer: Self-pay

## 2019-02-16 ENCOUNTER — Encounter (HOSPITAL_COMMUNITY): Payer: Self-pay

## 2019-02-16 DIAGNOSIS — I10 Essential (primary) hypertension: Secondary | ICD-10-CM | POA: Diagnosis not present

## 2019-02-16 DIAGNOSIS — Z79899 Other long term (current) drug therapy: Secondary | ICD-10-CM | POA: Diagnosis not present

## 2019-02-16 DIAGNOSIS — R51 Headache: Secondary | ICD-10-CM | POA: Insufficient documentation

## 2019-02-16 DIAGNOSIS — R519 Headache, unspecified: Secondary | ICD-10-CM

## 2019-02-16 LAB — BASIC METABOLIC PANEL
Anion gap: 10 (ref 5–15)
BUN: 10 mg/dL (ref 6–20)
CO2: 21 mmol/L — ABNORMAL LOW (ref 22–32)
Calcium: 8.1 mg/dL — ABNORMAL LOW (ref 8.9–10.3)
Chloride: 105 mmol/L (ref 98–111)
Creatinine, Ser: 0.57 mg/dL (ref 0.44–1.00)
GFR calc Af Amer: >60 mL/min (ref >60)
GFR calc non Af Amer: >60 mL/min (ref >60)
Glucose, Bld: 104 mg/dL — ABNORMAL HIGH (ref 70–99)
Potassium: 3.3 mmol/L — ABNORMAL LOW (ref 3.5–5.1)
Sodium: 136 mmol/L (ref 135–145)

## 2019-02-16 MED ORDER — METOCLOPRAMIDE HCL 5 MG/ML IJ SOLN
10.0000 mg | Freq: Once | INTRAMUSCULAR | Status: AC
  Administered 2019-02-16: 05:00:00 10 mg via INTRAVENOUS
  Filled 2019-02-16: qty 2

## 2019-02-16 MED ORDER — PROPRANOLOL HCL ER 80 MG PO CP24: 80.0000 mg | ORAL_CAPSULE | Freq: Every day | ORAL | 0 refills | Status: AC

## 2019-02-16 MED ORDER — DIPHENHYDRAMINE HCL 50 MG/ML IJ SOLN
25.0000 mg | Freq: Once | INTRAMUSCULAR | Status: AC
  Administered 2019-02-16: 25 mg via INTRAVENOUS
  Filled 2019-02-16: qty 1

## 2019-02-16 NOTE — ED Triage Notes (Signed)
Pt in with c/o HA x 2 days, states BP has been very elevated - 225/107 PTA. Denies any vision changes

## 2019-02-16 NOTE — Discharge Instructions (Signed)
Please read and follow all provided instructions.  Your diagnoses today include:  1. Bad headache   2. Essential hypertension     Tests performed today include:  Vital signs. See below for your results today.   Medications:  In the Emergency Department you received:  Reglan - antinausea/headache medication  Benadryl - antihistamine to counteract potential side effects of reglan   Propranolol - medication for high blood pressure and chronic headaches  Take any prescribed medications only as directed.  Additional information:  Follow any educational materials contained in this packet.  You are having a headache. No specific cause was found today for your headache. It may have been a migraine or other cause of headache. Stress, anxiety, fatigue, and depression are common triggers for headaches.   Your headache today does not appear to be life-threatening or require hospitalization, but often the exact cause of headaches is not determined in the emergency department. Therefore, follow-up with your doctor is very important to find out what may have caused your headache and whether or not you need any further diagnostic testing or treatment.   Sometimes headaches can appear benign (not harmful), but then more serious symptoms can develop which should prompt an immediate re-evaluation by your doctor or the emergency department.  BE VERY CAREFUL not to take multiple medicines containing Tylenol (also called acetaminophen). Doing so can lead to an overdose which can damage your liver and cause liver failure and possibly death.   Follow-up instructions: Please follow-up with your primary care provider in the next 7 days for further evaluation of your symptoms.   Return instructions:   Please return to the Emergency Department if you experience worsening symptoms.  Return if the medications do not resolve your headache, if it recurs, or if you have multiple episodes of vomiting or  cannot keep down fluids.  Return if you have a change from the usual headache.  RETURN IMMEDIATELY IF you:  Develop a sudden, severe headache  Develop confusion or become poorly responsive or faint  Develop a fever above 100.48F or problem breathing  Have a change in speech, vision, swallowing, or understanding  Develop new weakness, numbness, tingling, incoordination in your arms or legs  Have a seizure  Please return if you have any other emergent concerns.  Additional Information:  Your vital signs today were: BP (!) 145/107 (BP Location: Right Arm)    Pulse 92    Temp 97.8 F (36.6 C) (Oral)    Resp 15    Ht 5\' 4"  (1.626 m)    Wt 95.3 kg    LMP 05/03/2017 (Exact Date)    SpO2 99%    BMI 36.05 kg/m  If your blood pressure (BP) was elevated above 135/85 this visit, please have this repeated by your doctor within one month. --------------

## 2019-02-16 NOTE — ED Provider Notes (Signed)
[facility] EMERGENCY DEPARTMENT Provider Note   CSN: [id number] Arrival date & time: 02/16/19  0443    History   Chief Complaint Chief Complaint  Patient presents with  . Hypertension    HPI [name] is a 43 y.o. female.     Patient with history of chronic headaches, hypertension presents the emergency department today with complaint of frontal headache ongoing over the past several hours.  She attributes this to being a typical "migraine".  She took ibuprofen prior to arrival without any improvement.  Patient also notes that her blood pressures have been elevated recently.  She states that she checks her blood pressure nearly every day.  Prior to arrival she reports that her blood pressure has been as high as 225/107.  Upon arrival to the emergency department it is 145/107.  Patient states that typically her systolic blood pressure will be 140-160.  She has been on blood pressure medication in the past but not for at least the past year.  She denies any vision changes or vision loss.  She denies any strokelike symptoms such as slurred speech, trouble talking, trouble walking, weakness in her arms or her legs.  She has not had any chest pain or shortness of breath.  She does not have a history of any kind of kidney problems.  She denies other symptoms of illness.  She is open to restarting blood pressure medication.  She states that she is in the process of finding a primary care provider.     Past Medical History:  Diagnosis Date  . Anemia   . Dyspnea    SOME SOB WITH EXERTION  . GERD (gastroesophageal reflux disease)    OCCASIONAL - DIET AND PEPCID PRN  . Hypertension   . Migraines   . SVD (spontaneous vaginal delivery)    X 2    Patient Active Problem List   Diagnosis Date Noted  . Fibroid, uterine 05/08/2017  . Menorrhagia 02/26/2013  . OBESITY 09/02/2008  . ESSENTIAL HYPERTENSION 09/02/2008  . ANEMIA, IRON DEFICIENCY NOS 10/13/2006  .  MIGRAINE HEADACHE 10/13/2006  . ALLERGIC RHINITIS 10/13/2006    Past Surgical History:  Procedure Laterality Date  . ABDOMINAL HYSTERECTOMY    . BREAST LUMPECTOMY WITH RADIOACTIVE SEED LOCALIZATION Right 09/29/2014   Procedure: RIGHT BREAST LUMPECTOMY WITH RADIOACTIVE SEED LOCALIZATION;  Surgeon: Stark Klein, MD;  Location: [facility];  Service: General;  Laterality: Right;  . BREAST SURGERY Right 12/2016   BIOPSY - BENIGN  . CYSTOSCOPY N/A 05/08/2017   Procedure: CYSTOSCOPY;  Surgeon: Everett Graff, MD;  Location: [facility];  Service: Gynecology;  Laterality: N/A;  . HYSTERECTOMY ABDOMINAL WITH SALPINGECTOMY N/A 05/08/2017   Procedure: HYSTERECTOMY ABDOMINAL WITH BILATERAL SALPINGECTOMY;  Surgeon: Everett Graff, MD;  Location: [facility];  Service: Gynecology;  Laterality: N/A;     OB History    Gravida  2   Para  2   Term  2   Preterm      AB      Living        SAB      TAB      Ectopic      Multiple      Live Births               Home Medications    Prior to Admission medications   Medication Sig Start Date End Date Taking? Authorizing Provider  aspirin-acetaminophen-caffeine (EXCEDRIN MIGRAINE) [phone] MG per tablet Take 1  tablet by mouth every 6 (six) hours as needed for pain (migraines).    [provider]  famotidine (PEPCID AC) 10 MG tablet Take 10 mg by mouth as needed for heartburn.     [provider]  ibuprofen (ADVIL,MOTRIN) 600 MG tablet Take 1 tablet (600 mg total) by mouth every 6 (six) hours as needed (mild pain). 05/10/17   Everett Graff, MD  oxyCODONE-acetaminophen (PERCOCET/ROXICET) 5-325 MG tablet Take 1-2 tablets by mouth every 6 (six) hours as needed for severe pain (moderate to severe pain (when tolerating fluids)). 05/10/17   Everett Graff, MD    Family History Family History  Problem Relation Age of Onset  . Hyperlipidemia Mother   . Hypertension Father     Social History Social History    Tobacco Use  . Smoking status: Never Smoker  . Smokeless tobacco: Never Used  Substance Use Topics  . Alcohol use: No  . Drug use: No     Allergies   Penicillins   Review of Systems Review of Systems  Constitutional: Negative for fever.  HENT: Negative for congestion, dental problem, rhinorrhea and sinus pressure.   Eyes: Negative for photophobia, discharge, redness and visual disturbance.  Respiratory: Negative for shortness of breath.   Cardiovascular: Negative for chest pain.  Gastrointestinal: Negative for nausea and vomiting.  Musculoskeletal: Negative for gait problem, neck pain and neck stiffness.  Skin: Negative for rash.  Neurological: Positive for headaches. Negative for syncope, speech difficulty, weakness, light-headedness and numbness.  Psychiatric/Behavioral: Negative for confusion.     Physical Exam Updated Vital Signs BP (!) 145/107 (BP Location: Right Arm)   Pulse 93   Temp 97.8 F (36.6 C) (Oral)   Resp 15   Ht 5\' 4"  (1.626 m)   Wt 95.3 kg   LMP 05/03/2017 (Exact Date)   SpO2 99%   BMI 36.05 kg/m   Physical Exam Vitals signs and nursing note reviewed.  Constitutional:      Appearance: She is well-developed.  HENT:     Head: Normocephalic and atraumatic.     Right Ear: Tympanic membrane, ear canal and external ear normal.     Left Ear: Tympanic membrane, ear canal and external ear normal.     Nose: Nose normal.     Mouth/Throat:     Pharynx: Uvula midline.  Eyes:     General: Lids are normal.     Extraocular Movements:     Right eye: No nystagmus.     Left eye: No nystagmus.     Conjunctiva/sclera: Conjunctivae normal.     Pupils: Pupils are equal, round, and reactive to light.  Neck:     Musculoskeletal: Normal range of motion and neck supple.  Cardiovascular:     Rate and Rhythm: Normal rate and regular rhythm.  Pulmonary:     Effort: Pulmonary effort is normal.     Breath sounds: Normal breath sounds.  Abdominal:     Palpations:  Abdomen is soft.     Tenderness: There is no abdominal tenderness.  Musculoskeletal:     Cervical back: She exhibits normal range of motion, no tenderness and no bony tenderness.  Skin:    General: Skin is warm and dry.  Neurological:     Mental Status: She is alert and oriented to person, place, and time.     GCS: GCS eye subscore is 4. GCS verbal subscore is 5. GCS motor subscore is 6.     Cranial Nerves: No cranial nerve deficit.  Sensory: No sensory deficit.     Coordination: Coordination normal.     Gait: Gait normal.     Deep Tendon Reflexes: Reflexes are normal and symmetric.      ED Treatments / Results  Labs (all labs ordered are listed, but only abnormal results are displayed) Labs Reviewed  BASIC METABOLIC PANEL - Abnormal; Notable for the following components:      Result Value   Potassium 3.3 (*)    CO2 21 (*)    Glucose, Bld 104 (*)    Calcium 8.1 (*)    All other components within normal limits    EKG None  Radiology No results found.  Procedures Procedures (including critical care time)  Medications Ordered in ED Medications  metoCLOPramide (REGLAN) injection 10 mg (10 mg Intravenous Given 02/16/19 0516)  diphenhydrAMINE (BENADRYL) injection 25 mg (25 mg Intravenous Given 02/16/19 0516)     Initial Impression / Assessment and Plan / ED Course  I have reviewed the triage vital signs and the nursing notes.  Pertinent labs & imaging results that were available during my care of the patient were reviewed by me and considered in my medical decision making (see chart for details).        Patient seen and examined.  We will treat patient with migraine cocktail and in the interim check her creatinine.  Likely start on low-dose of antihypertensive given her history of hypertension.  Discussed with patient that she will need to find a primary care doctor for appropriate titration and refill.  She states that she is willing to do this.  Vital signs  reviewed and are as follows: BP (!) 145/107 (BP Location: Right Arm)   Pulse 93   Temp 97.8 F (36.6 C) (Oral)   Resp 15   Ht 5\' 4"  (1.626 m)   Wt 95.3 kg   LMP 05/03/2017 (Exact Date)   SpO2 99%   BMI 36.05 kg/m   6:06 AM patient is feeling much better after headache treatment.  We reviewed her lab work.  She has not been on a beta-blocker in the past.  Discussed benefits of this medication in regards to her blood pressure and frequent headaches.  She is willing to give it a try.  We discussed potential side effects of lightheadedness and dizziness.  She understands that she needs to follow-up to have this medication titrated and adjusted to her blood pressure.  She is comfortable with  Discharge to home at this time.  Final Clinical Impressions(s) / ED Diagnoses   Final diagnoses:  Bad headache  Essential hypertension   HA: Patient without high-risk features of headache including: sudden onset/thunderclap HA, no similar headache in past, altered mental status, accompanying seizure, headache with exertion, age > 9, history of immunocompromise, neck or shoulder pain, fever, use of anticoagulation, family history of spontaneous SAH, concomitant drug use, toxic exposure.   Patient has a normal complete neurological exam, normal vital signs, normal level of consciousness, no signs of meningismus, is well-appearing/non-toxic appearing, no signs of trauma.   Imaging with CT/MRI not indicated given history and physical exam findings.   No dangerous or life-threatening conditions suspected or identified by history, physical exam, and by work-up. No indications for hospitalization identified.    HTN: Currently untreated, chronic.  No signs of endorgan damage.  Normal creatinine.  Will start on extended release propranolol given indication of headaches and high blood pressure.  ED Discharge Orders         Ordered  propranolol ER (INDERAL LA) 80 MG 24 hr capsule  Daily     02/16/19 0604            Carlisle Cater, PA-C 35/57/32 2025    Kathy Fuel, MD 42/70/62 [phone]

## 2020-08-07 ENCOUNTER — Encounter: Payer: Self-pay | Admitting: Family Medicine

## 2020-08-07 ENCOUNTER — Other Ambulatory Visit: Payer: Self-pay

## 2020-08-07 ENCOUNTER — Ambulatory Visit (INDEPENDENT_AMBULATORY_CARE_PROVIDER_SITE_OTHER): Payer: 59 | Admitting: Family Medicine

## 2020-08-07 VITALS — BP 150/90 | HR 91 | Ht 64.0 in | Wt 217.1 lb

## 2020-08-07 DIAGNOSIS — Z114 Encounter for screening for human immunodeficiency virus [HIV]: Secondary | ICD-10-CM | POA: Diagnosis not present

## 2020-08-07 DIAGNOSIS — K439 Ventral hernia without obstruction or gangrene: Secondary | ICD-10-CM | POA: Diagnosis not present

## 2020-08-07 DIAGNOSIS — K469 Unspecified abdominal hernia without obstruction or gangrene: Secondary | ICD-10-CM | POA: Insufficient documentation

## 2020-08-07 DIAGNOSIS — G43109 Migraine with aura, not intractable, without status migrainosus: Secondary | ICD-10-CM | POA: Diagnosis not present

## 2020-08-07 DIAGNOSIS — K458 Other specified abdominal hernia without obstruction or gangrene: Secondary | ICD-10-CM

## 2020-08-07 DIAGNOSIS — Z1159 Encounter for screening for other viral diseases: Secondary | ICD-10-CM

## 2020-08-07 DIAGNOSIS — I10 Essential (primary) hypertension: Secondary | ICD-10-CM | POA: Diagnosis not present

## 2020-08-07 MED ORDER — SUMATRIPTAN SUCCINATE 50 MG PO TABS: 50.0000 mg | ORAL_TABLET | ORAL | 0 refills | Status: AC | PRN

## 2020-08-07 MED ORDER — CANDESARTAN CILEXETIL 8 MG PO TABS: 8.0000 mg | ORAL_TABLET | Freq: Every day | ORAL | 3 refills | Status: AC

## 2020-08-07 MED ORDER — CANDESARTAN CILEXETIL 8 MG PO TABS: 8.0000 mg | ORAL_TABLET | Freq: Every day | ORAL | 3 refills | Status: DC

## 2020-08-07 MED ORDER — SUMATRIPTAN SUCCINATE 50 MG PO TABS: 50.0000 mg | ORAL_TABLET | ORAL | 0 refills | Status: DC | PRN

## 2020-08-07 NOTE — Progress Notes (Signed)
    SUBJECTIVE:   CHIEF COMPLAINT / HPI:   Migraines Ms. Winegardner reports she has a previous diagnosis of migraines.  She notes that this is been going on for years and currently Effexor about once every other week.  She notes that her typical migraine feels like a right-sided throbbing which is accompanied by light and noise sensitivity.  She currently takes Excedrin Migraine which is helpful she would like to know if any medication can be given.  She is previously taken tramadol and antidepressant medication for migraines.  She is also previously on propranolol which she did not find helpful.  Concern for hernia For the past 7 months, she has noticed a small bump on her left lower abdomen next to her bellybutton.  It does not cause her any significant pain.  She notices it most when she has coughing fits.  She is assessed by a nurse at work who recommended that she talk to her physician about a possible hernia.  She has had 1 previous abdominal surgery which was an abdominal hysterectomy via a low transverse incision.  She has not had any trouble with changes in bowel movements or nausea/vomiting.  Hypertension She has previously been told that her blood pressure is running high although she is not currently treated for hypertension.  She has previously taken propranolol for migraine prophylaxis.  PERTINENT  PMH / PSH: Migraine history, hypertension, obesity  OBJECTIVE:   BP (!) 150/90   Pulse 91   Ht 5\' 4"  (1.626 m)   Wt 217 lb 2 oz (98.5 kg)   LMP 05/03/2017 (Exact Date)   SpO2 97%   BMI 37.27 kg/m    General: Alert and cooperative and appears to be in no acute distress HEENT: Neck non-tender without lymphadenopathy, masses or thyromegaly Cardio: Normal S1 and S2, no S3 or S4. Rhythm is regular. No murmurs or rubs.   Pulm: Clear to auscultation bilaterally, no crackles, wheezing, or diminished breath sounds. Normal respiratory effort Abdomen: Bowel sounds normal. Abdomen soft and  non-tender.  No appreciable mass or abnormality in the left lower quadrant. Extremities: No peripheral edema. Warm/ well perfused.  Strong radial pulse. Neuro: Cranial nerves grossly intact  ASSESSMENT/PLAN:   Migraine headache History is consistent with migraine headaches.  They appeared to be infrequent happening only once every other week.  We will start candesartan for hypertension which may help for migraine prophylaxis and also provide sumatriptan for abortive medicine. -Start candesartan -Start sumatriptan for prophylaxis  Essential hypertension Chart review demonstrates consistently elevated blood pressure.  Repeat blood pressure today was 150/90.  We will start candesartan today and follow-up in 2 weeks. -Start candesartan -Follow-up BMP -Follow-up in clinic in 2 weeks  Abdominal hernia Difficult to palpate on physical exam.  No evidence of obstruction or gangrene.  I will have her follow-up with general surgery, general surgery decide regarding further imaging.   Health maintenance -Follow-up HIV, hepatitis C screening  Matilde Haymaker, MD [facility]

## 2020-08-07 NOTE — Assessment & Plan Note (Signed)
History is consistent with migraine headaches.  They appeared to be infrequent happening only once every other week.  We will start candesartan for hypertension which may help for migraine prophylaxis and also provide sumatriptan for abortive medicine. -Start candesartan -Start sumatriptan for prophylaxis

## 2020-08-07 NOTE — Assessment & Plan Note (Signed)
Chart review demonstrates consistently elevated blood pressure.  Repeat blood pressure today was 150/90.  We will start candesartan today and follow-up in 2 weeks. -Start candesartan -Follow-up BMP -Follow-up in clinic in 2 weeks

## 2020-08-07 NOTE — Assessment & Plan Note (Signed)
Difficult to palpate on physical exam.  No evidence of obstruction or gangrene.  I will have her follow-up with general surgery, general surgery decide regarding further imaging.

## 2020-08-07 NOTE — Patient Instructions (Signed)
Migraine: The blood pressure medicine or starting you on today should be helpful to prevent migraines.  I have also provided sumatriptan for you to take as needed when your headaches begin.  Concerned about a hernia.  I have put in a referral to general surgery.  Should get a call in the next 1-2 weeks to set up an appointment with general surgery who can make further decisions about abdominal imaging.  Hypertension: We are going to get some blood work today.  Please start taking candesartan, your new blood pressure medicine, daily.  Please follow-up in 2 weeks so we can get repeat labs and discuss additional medical issues.

## 2020-08-08 ENCOUNTER — Encounter: Payer: Self-pay | Admitting: Family Medicine

## 2020-08-08 LAB — BASIC METABOLIC PANEL
BUN/Creatinine Ratio: 17 (ref 9–23)
BUN: 14 mg/dL (ref 6–24)
CO2: 23 mmol/L (ref 20–29)
Calcium: 9.2 mg/dL (ref 8.7–10.2)
Chloride: 101 mmol/L (ref 96–106)
Creatinine, Ser: 0.81 mg/dL (ref 0.57–1.00)
GFR calc Af Amer: 101 mL/min/{1.73_m2} (ref >59)
GFR calc non Af Amer: 87 mL/min/{1.73_m2} (ref >59)
Glucose: 84 mg/dL (ref 65–99)
Potassium: 3.3 mmol/L — ABNORMAL LOW (ref 3.5–5.2)
Sodium: 138 mmol/L (ref 134–144)

## 2020-08-08 LAB — HEPATITIS C ANTIBODY: Hep C Virus Ab: <0.1 s/co ratio (ref 0.0–0.9)

## 2020-08-08 LAB — HIV ANTIBODY (ROUTINE TESTING W REFLEX): HIV Screen 4th Generation wRfx: NONREACTIVE

## 2020-08-30 ENCOUNTER — Ambulatory Visit: Payer: 59 | Admitting: Family Medicine

## 2023-06-30 ENCOUNTER — Ambulatory Visit: Payer: Managed Care, Other (non HMO) | Admitting: Podiatry

## 2023-06-30 DIAGNOSIS — B353 Tinea pedis: Secondary | ICD-10-CM

## 2023-06-30 MED ORDER — TERBINAFINE HCL 250 MG PO TABS: 250.0000 mg | ORAL_TABLET | Freq: Every day | ORAL | 0 refills | Status: AC

## 2023-06-30 MED ORDER — CLOTRIMAZOLE-BETAMETHASONE 1-0.05 % EX CREA: 1.0000 | TOPICAL_CREAM | Freq: Every day | CUTANEOUS | 2 refills | Status: AC

## 2023-06-30 NOTE — Progress Notes (Addendum)
   No chief complaint on file.   HPI: 47 y.o. female presenting today as a new patient for evaluation of itching and burning sensation to the interdigital areas of the right foot.  Onset a few months ago.  Patient is concerned for athlete's foot.  She has tried different OTC antifungal topicals with no improvement.  Past Medical History:  Diagnosis Date   Anemia    Dyspnea    SOME SOB WITH EXERTION   GERD (gastroesophageal reflux disease)    OCCASIONAL - DIET AND PEPCID PRN   Hypertension    Migraines    SVD (spontaneous vaginal delivery)    X 2    Past Surgical History:  Procedure Laterality Date   ABDOMINAL HYSTERECTOMY     BREAST LUMPECTOMY WITH RADIOACTIVE SEED LOCALIZATION Right 09/29/2014   Procedure: RIGHT BREAST LUMPECTOMY WITH RADIOACTIVE SEED LOCALIZATION;  Surgeon: Almond Lint, MD;  Location: [facility];  Service: General;  Laterality: Right;   BREAST SURGERY Right 12/2016   BIOPSY - BENIGN   CYSTOSCOPY N/A 05/08/2017   Procedure: CYSTOSCOPY;  Surgeon: Osborn Coho, MD;  Location: [facility];  Service: Gynecology;  Laterality: N/A;   HYSTERECTOMY ABDOMINAL WITH SALPINGECTOMY N/A 05/08/2017   Procedure: HYSTERECTOMY ABDOMINAL WITH BILATERAL SALPINGECTOMY;  Surgeon: Osborn Coho, MD;  Location: [facility];  Service: Gynecology;  Laterality: N/A;    Allergies  Allergen Reactions   Penicillins Hives    Has patient had a PCN reaction causing immediate rash, facial/tongue/throat swelling, SOB or lightheadedness with hypotension: Yes Has patient had a PCN reaction causing severe rash involving mucus membranes or skin necrosis: No Has patient had a PCN reaction that required hospitalization: No Has patient had a PCN reaction occurring within the last 10 years: No If all of the above answers are "NO", then may proceed with Cephalosporin use.     Physical Exam: General: The patient is alert and oriented x3 in no acute distress.  Dermatology: Skin is warm, dry  and supple bilateral lower extremities.  No open lesions or macerations.  Pruritus noted to the interdigital areas of the right foot  Vascular: Palpable pedal pulses bilaterally. Capillary refill within normal limits.  No appreciable edema.  No erythema.  Neurological: Grossly intact via light touch  Musculoskeletal Exam: No pedal deformities noted   Assessment/Plan of Care: 1.  Tinea pedis/athlete's foot RT  -Patient evaluated -Prescription for Lamisil 250 mg #30 daily -Prescription for Lotrisone cream.  Apply twice daily -Maintain good foot hygiene and keeping her feet dry -Return to clinic as needed  *Patient's original appointment was scheduled with Dr. Ralene Cork.  Apparently she was not checked in appropriately and sent in the waiting room until after 5:00PM. End of day she was still in the waiting room and so I brought her back to be evaluated/treated. No charge for today's visit.      Felecia Shelling, DPM [facility]  Dr. Felecia Shelling, DPM    [address] [place], [place] [address]                Office [phone]  Fax [phone]

## 2023-06-30 NOTE — Addendum Note (Signed)
Addended by: Felecia Shelling on: 06/30/2023 05:38 PM   Modules accepted: Orders, Level of Service
